# Patient Record
Sex: Male | Born: 1935
Health system: Southern US, Community
[De-identification: ages and names within clinical notes are randomized; demographics above are authoritative.]

## PROBLEM LIST (undated history)

## (undated) DIAGNOSIS — J449 Chronic obstructive pulmonary disease, unspecified: Secondary | ICD-10-CM

## (undated) DIAGNOSIS — E785 Hyperlipidemia, unspecified: Secondary | ICD-10-CM

## (undated) DIAGNOSIS — R42 Dizziness and giddiness: Secondary | ICD-10-CM

## (undated) DIAGNOSIS — I10 Essential (primary) hypertension: Secondary | ICD-10-CM

## (undated) DIAGNOSIS — I2510 Atherosclerotic heart disease of native coronary artery without angina pectoris: Secondary | ICD-10-CM

## (undated) HISTORY — DX: Chronic obstructive pulmonary disease, unspecified: J44.9

## (undated) HISTORY — DX: Atherosclerotic heart disease of native coronary artery without angina pectoris: I25.10

## (undated) HISTORY — DX: Hyperlipidemia, unspecified: E78.5

## (undated) HISTORY — DX: Dizziness and giddiness: R42

## (undated) HISTORY — PX: CARDIAC SURGERY: SHX584

## (undated) HISTORY — DX: Essential (primary) hypertension: I10

## (undated) HISTORY — PX: CORONARY ARTERY BYPASS GRAFT: SHX141

---

## 1999-03-17 ENCOUNTER — Emergency Department (HOSPITAL_COMMUNITY): Admission: EM | Admit: 1999-03-17 | Discharge: 1999-03-17 | Payer: Self-pay | Admitting: *Deleted

## 1999-03-17 ENCOUNTER — Encounter: Payer: Self-pay | Admitting: *Deleted

## 2000-08-13 ENCOUNTER — Encounter: Payer: Self-pay | Admitting: Family Medicine

## 2000-08-13 ENCOUNTER — Encounter: Admission: RE | Admit: 2000-08-13 | Discharge: 2000-08-13 | Payer: Self-pay | Admitting: Family Medicine

## 2003-07-17 ENCOUNTER — Emergency Department (HOSPITAL_COMMUNITY): Admission: EM | Admit: 2003-07-17 | Discharge: 2003-07-17 | Payer: Self-pay | Admitting: Emergency Medicine

## 2004-04-07 ENCOUNTER — Ambulatory Visit (HOSPITAL_COMMUNITY): Admission: RE | Admit: 2004-04-07 | Discharge: 2004-04-07 | Payer: Self-pay | Admitting: Gastroenterology

## 2005-11-21 ENCOUNTER — Emergency Department (HOSPITAL_COMMUNITY): Admission: EM | Admit: 2005-11-21 | Discharge: 2005-11-21 | Payer: Self-pay | Admitting: Emergency Medicine

## 2012-02-12 ENCOUNTER — Other Ambulatory Visit (HOSPITAL_COMMUNITY): Payer: Self-pay | Admitting: Family Medicine

## 2012-02-12 DIAGNOSIS — R222 Localized swelling, mass and lump, trunk: Secondary | ICD-10-CM

## 2012-02-16 ENCOUNTER — Ambulatory Visit (HOSPITAL_COMMUNITY)
Admission: RE | Admit: 2012-02-16 | Discharge: 2012-02-16 | Disposition: A | Discharge status: Home / Self Care | Payer: Medicare Other | Source: Ambulatory Visit | Attending: Family Medicine | Admitting: Family Medicine

## 2012-02-16 DIAGNOSIS — R911 Solitary pulmonary nodule: Secondary | ICD-10-CM | POA: Insufficient documentation

## 2012-02-16 DIAGNOSIS — R222 Localized swelling, mass and lump, trunk: Secondary | ICD-10-CM

## 2012-05-22 ENCOUNTER — Encounter: Payer: Self-pay | Admitting: Family Medicine

## 2012-05-22 ENCOUNTER — Ambulatory Visit (INDEPENDENT_AMBULATORY_CARE_PROVIDER_SITE_OTHER): Payer: Medicare Other | Admitting: Family Medicine

## 2012-05-22 VITALS — BP 126/69 | HR 60 | Temp 97.5°F | Ht 66.0 in | Wt 211.0 lb

## 2012-05-22 DIAGNOSIS — H60399 Other infective otitis externa, unspecified ear: Secondary | ICD-10-CM

## 2012-05-22 DIAGNOSIS — H6120 Impacted cerumen, unspecified ear: Secondary | ICD-10-CM

## 2012-05-22 DIAGNOSIS — H6690 Otitis media, unspecified, unspecified ear: Secondary | ICD-10-CM

## 2012-05-22 DIAGNOSIS — H6121 Impacted cerumen, right ear: Secondary | ICD-10-CM

## 2012-05-22 DIAGNOSIS — H6691 Otitis media, unspecified, right ear: Secondary | ICD-10-CM

## 2012-05-22 DIAGNOSIS — H60391 Other infective otitis externa, right ear: Secondary | ICD-10-CM

## 2012-05-22 DIAGNOSIS — K148 Other diseases of tongue: Secondary | ICD-10-CM

## 2012-05-22 MED ORDER — AMOXICILLIN-POT CLAVULANATE 875-125 MG PO TABS: 1.0000 | ORAL_TABLET | Freq: Two times a day (BID) | ORAL | Status: DC

## 2012-05-22 MED ORDER — CIPROFLOXACIN-DEXAMETHASONE 0.3-0.1 % OT SUSP: 4.0000 [drp] | Freq: Two times a day (BID) | OTIC | Status: DC

## 2012-05-22 NOTE — Progress Notes (Signed)
R ear irrigated until clear.  Pt tolerated well.

## 2012-05-22 NOTE — Progress Notes (Signed)
  Subjective:    Patient ID: Matthew Franklin, male    DOB: [birth year], 78 y.o.   MRN: [id number]  HPI R ear itching x 1-2 weeks  Has had R ear irritation  Wears bilateral hearing aids.  Minimal ear pain.  Tried putting alcohol in ear, caused some bleeding No headache.  Also with tongue lesion over last 3 months.  No bleeding or redness Prior smoker many years ago.  No chewing tobacco.  No alcohol per pt.    Review of Systems  All other systems reviewed and are negative.       Objective:   Physical Exam  Constitutional: He appears well-developed and well-nourished.  HENT:  Head: Normocephalic and atraumatic.  Mouth/Throat:    + R ear canal erythema. Mild tenderness to otoscopic evaluation. + mild TM bulging. Mild R cerumen impaction.   + flat mildly erythematous lesion on central tongue   Eyes: Conjunctivae are normal. Pupils are equal, round, and reactive to light.  Neck: Normal range of motion. Neck supple.  Cardiovascular: Normal rate and regular rhythm.   Pulmonary/Chest: Effort normal.  Abdominal: Soft.  Musculoskeletal: Normal range of motion.  Lymphadenopathy:    He has no cervical adenopathy.  Neurological: He is alert.  Skin: Skin is warm.      Assessment & Plan:  Ear irrigation at bedside.  Will place on ciprodex and augmentin for infectious coverage.  Follow up with ENT for tongue lesion.     The patient and/or caregiver has been counseled thoroughly with regard to treatment plan and/or medications prescribed including dosage, schedule, interactions, rationale for use, and possible side effects and they verbalize understanding. Diagnoses and expected course of recovery discussed and will return if not improved as expected or if the condition worsens. Patient and/or caregiver verbalized understanding.

## 2012-05-22 NOTE — Patient Instructions (Signed)
Cerumen Impaction A cerumen impaction is when the wax in your ear forms a plug. This plug usually causes reduced hearing. Sometimes it also causes an earache or dizziness. Removing a cerumen impaction can be difficult and painful. The wax sticks to the ear canal. The canal is sensitive and bleeds easily. If you try to remove a heavy wax buildup with a cotton tipped swab, you may push it in further. Irrigation with water, suction, and small ear curettes may be used to clear out the wax. If the impaction is fixed to the skin in the ear canal, ear drops may be needed for a few days to loosen the wax. People who build up a lot of wax frequently can use ear wax removal products available in your local drugstore. SEEK MEDICAL CARE IF:  You develop an earache, increased hearing loss, or marked dizziness. Document Released: 01/27/2004 Document Revised: 03/13/2011 Document Reviewed: 03/18/2009 [employer] Patient Information 2014 [employer].  

## 2012-06-18 ENCOUNTER — Other Ambulatory Visit: Payer: Self-pay | Admitting: *Deleted

## 2012-06-19 NOTE — Telephone Encounter (Signed)
Started refill but denied

## 2012-06-20 ENCOUNTER — Other Ambulatory Visit (INDEPENDENT_AMBULATORY_CARE_PROVIDER_SITE_OTHER): Payer: Medicare Other

## 2012-06-20 DIAGNOSIS — I10 Essential (primary) hypertension: Secondary | ICD-10-CM

## 2012-06-20 DIAGNOSIS — E785 Hyperlipidemia, unspecified: Secondary | ICD-10-CM

## 2012-06-20 LAB — COMPLETE METABOLIC PANEL WITH GFR
ALT: 14 U/L (ref 0–53)
AST: 12 U/L (ref 0–37)
Albumin: 4 g/dL (ref 3.5–5.2)
Alkaline Phosphatase: 42 U/L (ref 39–117)
BUN: 16 mg/dL (ref 6–23)
CO2: 28 mEq/L (ref 19–32)
Calcium: 9.3 mg/dL (ref 8.4–10.5)
Chloride: 104 mEq/L (ref 96–112)
Creat: 1.28 mg/dL (ref 0.50–1.35)
GFR, Est African American: 62 mL/min
GFR, Est Non African American: 54 mL/min — ABNORMAL LOW
Glucose, Bld: 114 mg/dL — ABNORMAL HIGH (ref 70–99)
Potassium: 5.3 mEq/L (ref 3.5–5.3)
Sodium: 140 mEq/L (ref 135–145)
Total Bilirubin: 0.6 mg/dL (ref 0.3–1.2)
Total Protein: 6.7 g/dL (ref 6.0–8.3)

## 2012-06-20 NOTE — Progress Notes (Signed)
Patient came in for labs only.

## 2012-06-21 LAB — NMR LIPOPROFILE WITH LIPIDS
Cholesterol, Total: 157 mg/dL (ref <200)
HDL Particle Number: 33.4 umol/L (ref >=30.5)
HDL Size: 8.8 nm — ABNORMAL LOW (ref >=9.2)
HDL-C: 43 mg/dL (ref >=40)
LDL (calc): 91 mg/dL (ref <100)
LDL Particle Number: 1494 nmol/L — ABNORMAL HIGH (ref <1000)
LDL Size: 20 nm — ABNORMAL LOW (ref >20.5)
LP-IR Score: 67 — ABNORMAL HIGH (ref <=45)
Large HDL-P: 4.2 umol/L — ABNORMAL LOW (ref >=4.8)
Large VLDL-P: 4.9 nmol/L — ABNORMAL HIGH (ref <=2.7)
Small LDL Particle Number: 1057 nmol/L — ABNORMAL HIGH (ref <=527)
Triglycerides: 116 mg/dL (ref <150)
VLDL Size: 52.3 nm — ABNORMAL HIGH (ref <=46.6)

## 2012-07-16 ENCOUNTER — Other Ambulatory Visit: Payer: Self-pay | Admitting: Family Medicine

## 2012-07-26 ENCOUNTER — Other Ambulatory Visit: Payer: Self-pay | Admitting: *Deleted

## 2012-07-26 MED ORDER — LOSARTAN POTASSIUM-HCTZ 50-12.5 MG PO TABS: 1.0000 | ORAL_TABLET | Freq: Every day | ORAL | Status: DC

## 2012-07-26 MED ORDER — SIMVASTATIN 20 MG PO TABS: 20.0000 mg | ORAL_TABLET | Freq: Every day | ORAL | Status: DC

## 2012-09-17 ENCOUNTER — Other Ambulatory Visit: Payer: Self-pay

## 2012-09-17 MED ORDER — METOPROLOL TARTRATE 50 MG PO TABS: 50.0000 mg | ORAL_TABLET | Freq: Every day | ORAL | Status: DC

## 2012-10-28 ENCOUNTER — Other Ambulatory Visit: Payer: Self-pay

## 2012-10-28 NOTE — Telephone Encounter (Signed)
Last seen 05/22/12  Dr Alvester Morin   This not on [facility] list

## 2012-11-06 ENCOUNTER — Other Ambulatory Visit: Payer: Self-pay

## 2012-11-06 MED ORDER — OMEPRAZOLE 20 MG PO CPDR: 20.0000 mg | DELAYED_RELEASE_CAPSULE | Freq: Every day | ORAL | Status: DC

## 2012-11-15 ENCOUNTER — Other Ambulatory Visit: Payer: Self-pay | Admitting: *Deleted

## 2012-11-15 MED ORDER — METOPROLOL TARTRATE 50 MG PO TABS: 50.0000 mg | ORAL_TABLET | Freq: Every day | ORAL | Status: DC

## 2012-11-19 ENCOUNTER — Ambulatory Visit (INDEPENDENT_AMBULATORY_CARE_PROVIDER_SITE_OTHER): Payer: Medicare Other | Admitting: Family Medicine

## 2012-11-19 ENCOUNTER — Encounter: Payer: Self-pay | Admitting: Family Medicine

## 2012-11-19 VITALS — BP 138/84 | HR 71 | Temp 97.8°F | Ht 66.0 in | Wt 216.4 lb

## 2012-11-19 DIAGNOSIS — R42 Dizziness and giddiness: Secondary | ICD-10-CM

## 2012-11-19 DIAGNOSIS — J4489 Other specified chronic obstructive pulmonary disease: Secondary | ICD-10-CM

## 2012-11-19 DIAGNOSIS — H60399 Other infective otitis externa, unspecified ear: Secondary | ICD-10-CM

## 2012-11-19 DIAGNOSIS — J309 Allergic rhinitis, unspecified: Secondary | ICD-10-CM

## 2012-11-19 DIAGNOSIS — K219 Gastro-esophageal reflux disease without esophagitis: Secondary | ICD-10-CM

## 2012-11-19 DIAGNOSIS — H60391 Other infective otitis externa, right ear: Secondary | ICD-10-CM

## 2012-11-19 DIAGNOSIS — E785 Hyperlipidemia, unspecified: Secondary | ICD-10-CM | POA: Insufficient documentation

## 2012-11-19 DIAGNOSIS — J449 Chronic obstructive pulmonary disease, unspecified: Secondary | ICD-10-CM

## 2012-11-19 DIAGNOSIS — I10 Essential (primary) hypertension: Secondary | ICD-10-CM

## 2012-11-19 LAB — POCT CBC
Granulocyte percent: 61.9 %G (ref 37–80)
HCT, POC: 46.3 % (ref 43.5–53.7)
Hemoglobin: 15.3 g/dL (ref 14.1–18.1)
Lymph, poc: 2.2 (ref 0.6–3.4)
MCH, POC: 33.3 pg — AB (ref 27–31.2)
MCHC: 32.9 g/dL (ref 31.8–35.4)
MCV: 101.3 fL — AB (ref 80–97)
MPV: 7.2 fL (ref 0–99.8)
POC Granulocyte: 4.1 (ref 2–6.9)
POC LYMPH PERCENT: 32.1 %L (ref 10–50)
Platelet Count, POC: 275 10*3/uL (ref 142–424)
RBC: 4.6 M/uL — AB (ref 4.69–6.13)
RDW, POC: 12.2 %
WBC: 6.7 10*3/uL (ref 4.6–10.2)

## 2012-11-19 MED ORDER — METOPROLOL TARTRATE 50 MG PO TABS: 50.0000 mg | ORAL_TABLET | Freq: Every day | ORAL | Status: DC

## 2012-11-19 MED ORDER — CIPROFLOXACIN-HYDROCORTISONE 0.2-1 % OT SUSP: 3.0000 [drp] | Freq: Two times a day (BID) | OTIC | Status: DC

## 2012-11-19 MED ORDER — SIMVASTATIN 20 MG PO TABS: 20.0000 mg | ORAL_TABLET | Freq: Every day | ORAL | Status: DC

## 2012-11-19 MED ORDER — OMEPRAZOLE 20 MG PO CPDR: 20.0000 mg | DELAYED_RELEASE_CAPSULE | Freq: Every day | ORAL | Status: DC

## 2012-11-19 MED ORDER — LOSARTAN POTASSIUM-HCTZ 50-12.5 MG PO TABS: 1.0000 | ORAL_TABLET | Freq: Every day | ORAL | Status: DC

## 2012-11-19 MED ORDER — FLUTICASONE-SALMETEROL 250-50 MCG/DOSE IN AEPB: 1.0000 | INHALATION_SPRAY | Freq: Two times a day (BID) | RESPIRATORY_TRACT | Status: DC

## 2012-11-19 MED ORDER — MECLIZINE HCL 25 MG PO TABS: 25.0000 mg | ORAL_TABLET | Freq: Three times a day (TID) | ORAL | Status: DC | PRN

## 2012-11-19 NOTE — Patient Instructions (Signed)
Health Maintenance, Males A healthy lifestyle and preventative care can promote health and wellness.  Maintain regular health, dental, and eye exams.  Eat a healthy diet. Foods like vegetables, fruits, whole grains, low-fat dairy products, and lean protein foods contain the nutrients you need without too many calories. Decrease your intake of foods high in solid fats, added sugars, and salt. Get information about a proper diet from your caregiver, if necessary.  Regular physical exercise is one of the most important things you can do for your health. Most adults should get at least 150 minutes of moderate-intensity exercise (any activity that increases your heart rate and causes you to sweat) each week. In addition, most adults need muscle-strengthening exercises on 2 or more days a week.   Maintain a healthy weight. The body mass index (BMI) is a screening tool to identify possible weight problems. It provides an estimate of body fat based on height and weight. Your caregiver can help determine your BMI, and can help you achieve or maintain a healthy weight. For adults 20 years and older:  A BMI below 18.5 is considered underweight.  A BMI of 18.5 to 24.9 is normal.  A BMI of 25 to 29.9 is considered overweight.  A BMI of 30 and above is considered obese.  Maintain normal blood lipids and cholesterol by exercising and minimizing your intake of saturated fat. Eat a balanced diet with plenty of fruits and vegetables. Blood tests for lipids and cholesterol should begin at age 20 and be repeated every 5 years. If your lipid or cholesterol levels are high, you are over 50, or you are a high risk for heart disease, you may need your cholesterol levels checked more frequently.Ongoing high lipid and cholesterol levels should be treated with medicines, if diet and exercise are not effective.  If you smoke, find out from your caregiver how to quit. If you do not use tobacco, do not start.  Lung  cancer screening is recommended for adults aged 55 80 years who are at high risk for developing lung cancer because of a history of smoking. Yearly low-dose computed tomography (CT) is recommended for people who have at least a 30-pack-year history of smoking and are a current smoker or have quit within the past 15 years. A pack year of smoking is smoking an average of 1 pack of cigarettes a day for 1 year (for example: 1 pack a day for 30 years or 2 packs a day for 15 years). Yearly screening should continue until the smoker has stopped smoking for at least 15 years. Yearly screening should also be stopped for people who develop a health problem that would prevent them from having lung cancer treatment.  If you choose to drink alcohol, do not exceed 2 drinks per day. One drink is considered to be 12 ounces (355 mL) of beer, 5 ounces (148 mL) of wine, or 1.5 ounces (44 mL) of liquor.  Avoid use of street drugs. Do not share needles with anyone. Ask for help if you need support or instructions about stopping the use of drugs.  High blood pressure causes heart disease and increases the risk of stroke. Blood pressure should be checked at least every 1 to 2 years. Ongoing high blood pressure should be treated with medicines if weight loss and exercise are not effective.  If you are 45 to 76 years old, ask your caregiver if you should take aspirin to prevent heart disease.  Diabetes screening involves taking a blood   sample to check your fasting blood sugar level. This should be done once every 3 years, after age 45, if you are within normal weight and without risk factors for diabetes. Testing should be considered at a younger age or be carried out more frequently if you are overweight and have at least 1 risk factor for diabetes.  Colorectal cancer can be detected and often prevented. Most routine colorectal cancer screening begins at the age of 50 and continues through age 75. However, your caregiver may  recommend screening at an earlier age if you have risk factors for colon cancer. On a yearly basis, your caregiver may provide home test kits to check for hidden blood in the stool. Use of a small camera at the end of a tube, to directly examine the colon (sigmoidoscopy or colonoscopy), can detect the earliest forms of colorectal cancer. Talk to your caregiver about this at age 50, when routine screening begins. Direct examination of the colon should be repeated every 5 to 10 years through age 75, unless early forms of pre-cancerous polyps or small growths are found.  Hepatitis C blood testing is recommended for all people born from 1945 through 1965 and any individual with known risks for hepatitis C.  Healthy men should no longer receive prostate-specific antigen (PSA) blood tests as part of routine cancer screening. Consult with your caregiver about prostate cancer screening.  Testicular cancer screening is not recommended for adolescents or adult males who have no symptoms. Screening includes self-exam, caregiver exam, and other screening tests. Consult with your caregiver about any symptoms you have or any concerns you have about testicular cancer.  Practice safe sex. Use condoms and avoid high-risk sexual practices to reduce the spread of sexually transmitted infections (STIs).  Use sunscreen. Apply sunscreen liberally and repeatedly throughout the day. You should seek shade when your shadow is shorter than you. Protect yourself by wearing long sleeves, pants, a wide-brimmed hat, and sunglasses year round, whenever you are outdoors.  Notify your caregiver of new moles or changes in moles, especially if there is a change in shape or color. Also notify your caregiver if a mole is larger than the size of a pencil eraser.  A one-time screening for abdominal aortic aneurysm (AAA) and surgical repair of large AAAs by sound wave imaging (ultrasonography) is recommended for ages 65 to 75 years who are  current or former smokers.  Stay current with your immunizations. Document Released: 06/17/2007 Document Revised: 04/15/2012 Document Reviewed: 05/16/2010 Exit[employer] Patient Information 2014 [employer].  

## 2012-11-19 NOTE — Progress Notes (Signed)
Subjective:    Patient ID: Matthew Franklin, male    DOB: [birth year], 78 y.o.   MRN: [id number]  Hypertension This is a chronic problem. The current episode started more than 1 year ago. The problem has been resolved since onset. The problem is controlled. Pertinent negatives include no anxiety, blurred vision, chest pain, palpitations, peripheral edema or shortness of breath. Risk factors for coronary artery disease include dyslipidemia, family history and male gender. Past treatments include beta blockers, diuretics and angiotensin blockers. The current treatment provides moderate improvement. Hypertensive end-organ damage includes CAD/MI. There is no history of a thyroid problem. There is no history of sleep apnea.  Gastrophageal Reflux He reports no abdominal pain, no chest pain, no coughing, no dysphagia, no heartburn, no nausea or no sore throat. This is a chronic problem. The current episode started more than 1 year ago. The problem occurs rarely. The problem has been resolved. He has tried a PPI for the symptoms. The treatment provided moderate relief.  Hyperlipidemia This is a chronic problem. The current episode started more than 1 year ago. The problem is uncontrolled. Recent lipid tests were reviewed and are high. Exacerbating diseases include obesity. He has no history of liver disease. Pertinent negatives include no chest pain or shortness of breath. Current antihyperlipidemic treatment includes statins. The current treatment provides moderate improvement of lipids. Risk factors for coronary artery disease include dyslipidemia, family history, hypertension, male sex and obesity.  COPD Pt currently taking advair daily- Pt states it controlling his breathing well  *Pt with c/o of ears "itching". Pt denies any pain or pressure. Pt states the "itching" started about 6 months and can not wear his hearing aids. Pt states that when he goes days without wearing his hearing aids, the itching goes  away. Pt states he was seen in office, for this same problem, and was prescribed an antibiotic which helped about 6 months ago.     Review of Systems  HENT: Positive for ear pain. Negative for sore throat.   Eyes: Negative for blurred vision.  Respiratory: Negative for cough and shortness of breath.   Cardiovascular: Negative for chest pain and palpitations.  Gastrointestinal: Negative for heartburn, dysphagia, nausea and abdominal pain.  All other systems reviewed and are negative.       Objective:   Physical Exam  Vitals reviewed. Constitutional: He is oriented to person, place, and time. He appears well-developed and well-nourished.  HENT:  Head: Normocephalic.  Right Ear: Decreased hearing is noted.  Left Ear: Decreased hearing is noted.  Erythema in bilateral canal   Eyes: Pupils are equal, round, and reactive to light.  Neck: Normal range of motion. Neck supple. No thyromegaly present.  Cardiovascular: Normal rate, regular rhythm and intact distal pulses.   No murmur heard. Pulmonary/Chest: Effort normal and breath sounds normal. No respiratory distress. He has no wheezes.  Abdominal: Soft. Bowel sounds are normal. There is no tenderness. There is no rebound.  Musculoskeletal: Normal range of motion. He exhibits no edema and no tenderness.  Neurological: He is alert and oriented to person, place, and time.  Skin: Skin is warm and dry.  Psychiatric: He has a normal mood and affect. His behavior is normal. Judgment and thought content normal.    BP 138/84  Pulse 71  Temp(Src) 97.8 F (36.6 C) (Oral)  Ht 5\' 6"  (1.676 m)  Wt 216 lb 6.4 oz (98.158 kg)  BMI 34.94 kg/m2       Assessment & Plan:  GERD (gastroesophageal reflux disease)  Hypertension - Plan: losartan-hydrochlorothiazide (HYZAAR) 50-12.5 MG per tablet, metoprolol (LOPRESSOR) 50 MG tablet, omeprazole (PRILOSEC) 20 MG capsule  Hyperlipidemia LDL goal < 100 - Plan: simvastatin (ZOCOR) 20 MG tablet  COPD  (chronic obstructive pulmonary disease) - Plan: POCT CBC, CMP14+EGFR, Lipid panel, Thyroid Panel With TSH  Vertigo - Plan: meclizine (ANTIVERT) 25 MG tablet  Allergic rhinitis - Plan: Fluticasone-Salmeterol (ADVAIR DISKUS) 250-50 MCG/DOSE AEPB  Otitis, externa, infective, right - Plan: ciprofloxacin-hydrocortisone (CIPRO HC) otic suspension  Deatra Canter FNP

## 2012-11-20 LAB — THYROID PANEL WITH TSH
Free Thyroxine Index: 2 (ref 1.2–4.9)
T3 Uptake Ratio: 28 % (ref 24–39)
T4, Total: 7.1 ug/dL (ref 4.5–12.0)
TSH: 1.87 u[IU]/mL (ref 0.450–4.500)

## 2012-11-20 LAB — CMP14+EGFR
ALT: 17 IU/L (ref 0–44)
AST: 14 IU/L (ref 0–40)
Albumin/Globulin Ratio: 1.9 (ref 1.1–2.5)
Albumin: 4.4 g/dL (ref 3.5–4.8)
Alkaline Phosphatase: 58 IU/L (ref 39–117)
BUN/Creatinine Ratio: 18 (ref 10–22)
BUN: 16 mg/dL (ref 8–27)
CO2: 27 mmol/L (ref 18–29)
Calcium: 9.8 mg/dL (ref 8.6–10.2)
Chloride: 97 mmol/L (ref 97–108)
Creatinine, Ser: 0.87 mg/dL (ref 0.76–1.27)
GFR calc Af Amer: 97 mL/min/{1.73_m2} (ref >59)
GFR calc non Af Amer: 84 mL/min/{1.73_m2} (ref >59)
Globulin, Total: 2.3 g/dL (ref 1.5–4.5)
Glucose: 113 mg/dL — ABNORMAL HIGH (ref 65–99)
Potassium: 4.8 mmol/L (ref 3.5–5.2)
Sodium: 138 mmol/L (ref 134–144)
Total Bilirubin: 0.3 mg/dL (ref 0.0–1.2)
Total Protein: 6.7 g/dL (ref 6.0–8.5)

## 2012-11-20 LAB — LIPID PANEL
Chol/HDL Ratio: 3.9 ratio units (ref 0.0–5.0)
Cholesterol, Total: 204 mg/dL — ABNORMAL HIGH (ref 100–199)
HDL: 52 mg/dL (ref >39)
LDL Calculated: 118 mg/dL — ABNORMAL HIGH (ref 0–99)
Triglycerides: 169 mg/dL — ABNORMAL HIGH (ref 0–149)
VLDL Cholesterol Cal: 34 mg/dL (ref 5–40)

## 2012-12-03 ENCOUNTER — Telehealth: Payer: Self-pay | Admitting: Family Medicine

## 2013-01-13 ENCOUNTER — Telehealth: Payer: Self-pay | Admitting: Family Medicine

## 2013-01-13 NOTE — Telephone Encounter (Signed)
appt made

## 2013-01-16 ENCOUNTER — Ambulatory Visit (INDEPENDENT_AMBULATORY_CARE_PROVIDER_SITE_OTHER): Payer: Medicare HMO | Admitting: Family Medicine

## 2013-01-16 ENCOUNTER — Encounter: Payer: Self-pay | Admitting: Family Medicine

## 2013-01-16 VITALS — BP 96/56 | HR 72 | Temp 98.7°F | Ht 66.0 in | Wt 213.0 lb

## 2013-01-16 DIAGNOSIS — F411 Generalized anxiety disorder: Secondary | ICD-10-CM

## 2013-01-16 DIAGNOSIS — J441 Chronic obstructive pulmonary disease with (acute) exacerbation: Secondary | ICD-10-CM

## 2013-01-16 DIAGNOSIS — G4733 Obstructive sleep apnea (adult) (pediatric): Secondary | ICD-10-CM

## 2013-01-16 DIAGNOSIS — J449 Chronic obstructive pulmonary disease, unspecified: Secondary | ICD-10-CM

## 2013-01-16 DIAGNOSIS — H6120 Impacted cerumen, unspecified ear: Secondary | ICD-10-CM

## 2013-01-16 DIAGNOSIS — R0902 Hypoxemia: Secondary | ICD-10-CM

## 2013-01-16 DIAGNOSIS — Z23 Encounter for immunization: Secondary | ICD-10-CM

## 2013-01-16 MED ORDER — ACLIDINIUM BROMIDE 400 MCG/ACT IN AEPB: 400.0000 ug | INHALATION_SPRAY | Freq: Two times a day (BID) | RESPIRATORY_TRACT | Status: DC

## 2013-01-16 MED ORDER — ALBUTEROL SULFATE (2.5 MG/3ML) 0.083% IN NEBU: 2.5000 mg | INHALATION_SOLUTION | Freq: Four times a day (QID) | RESPIRATORY_TRACT | Status: DC | PRN

## 2013-01-16 MED ORDER — ALPRAZOLAM 0.5 MG PO TABS: 0.5000 mg | ORAL_TABLET | Freq: Two times a day (BID) | ORAL | Status: DC | PRN

## 2013-01-16 MED ORDER — SIMVASTATIN 40 MG PO TABS: 40.0000 mg | ORAL_TABLET | Freq: Every day | ORAL | Status: DC

## 2013-01-16 NOTE — Patient Instructions (Addendum)
Pneumococcal Vaccine, Polyvalent suspension for injection What is this medicine? PNEUMOCOCCAL VACCINE, POLYVALENT (NEU mo KOK al vak SEEN, pol ee VEY luhnt) is a vaccine to prevent pneumococcus bacteria infection. These bacteria are a major cause of ear infections, 'Strep throat' infections, and serious pneumonia, meningitis, or blood infections worldwide. These vaccines help the body to produce antibodies (protective substances) that help your body defend against these bacteria. This vaccine is recommended for infants and young children. This vaccine will not treat an infection. This medicine may be used for other purposes; ask your health care provider or pharmacist if you have questions. COMMON BRAND NAME(S): Prevnar 13 , Prevnar What should I tell my health care provider before I take this medicine? They need to know if you have any of these conditions: -bleeding problems -fever -immune system problems -low platelet count in the blood -seizures -an unusual or allergic reaction to pneumococcal vaccine, diphtheria toxoid, other vaccines, latex, other medicines, foods, dyes, or preservatives -pregnant or trying to get pregnant -breast-feeding How should I use this medicine? This vaccine is for injection into a muscle. It is given by a health care professional. A copy of Vaccine Information Statements will be given before each vaccination. Read this sheet carefully each time. The sheet may change frequently. Talk to your pediatrician regarding the use of this medicine in children. While this drug may be prescribed for children as young as 23 weeks old for selected conditions, precautions do apply. Overdosage: If you think you have taken too much of this medicine contact a poison control center or emergency room at once. NOTE: This medicine is only for you. Do not share this medicine with others. What if I miss a dose? It is important not to miss your dose. Call your doctor or health care  professional if you are unable to keep an appointment. What may interact with this medicine? -medicines for cancer chemotherapy -medicines that suppress your immune function -medicines that treat or prevent blood clots like warfarin, enoxaparin, and dalteparin -steroid medicines like prednisone or cortisone This list may not describe all possible interactions. Give your health care provider a list of all the medicines, herbs, non-prescription drugs, or dietary supplements you use. Also tell them if you smoke, drink alcohol, or use illegal drugs. Some items may interact with your medicine. What should I watch for while using this medicine? Mild fever and pain should go away in 3 days or less. Report any unusual symptoms to your doctor or health care professional. What side effects may I notice from receiving this medicine? Side effects that you should report to your doctor or health care professional as soon as possible: -allergic reactions like skin rash, itching or hives, swelling of the face, lips, or tongue -breathing problems -confused -fever over 102 degrees F -pain, tingling, numbness in the hands or feet -seizures -unusual bleeding or bruising -unusual muscle weakness Side effects that usually do not require medical attention (report to your doctor or health care professional if they continue or are bothersome): -aches and pains -diarrhea -fever of 102 degrees F or less -headache -irritable -loss of appetite -pain, tender at site where injected -trouble sleeping This list may not describe all possible side effects. Call your doctor for medical advice about side effects. You may report side effects to FDA at 1-800-FDA-1088. Where should I keep my medicine? This does not apply. This vaccine is given in a clinic, pharmacy, doctor's office, or other health care setting and will not be stored at  home. NOTE: This sheet is a summary. It may not cover all possible information. If you  have questions about this medicine, talk to your doctor, pharmacist, or health care provider.  2014, [employer]vier/Gold Standard. (2008-03-03 10:17:22) Chronic Obstructive Pulmonary Disease Chronic obstructive pulmonary disease (COPD) is a common lung condition in which airflow from the lungs is limited. COPD is a general term that can be used to describe many different lung problems that limit airflow, including both chronic bronchitis and emphysema. If you have COPD, your lung function will probably never return to normal, but there are measures you can take to improve lung function and make yourself feel better.  CAUSES   Smoking (common).   Exposure to secondhand smoke.   Genetic problems.  Chronic inflammatory lung diseases or recurrent infections. SYMPTOMS   Shortness of breath, especially with physical activity.   Deep, persistent (chronic) cough with a large amount of thick mucus.   Wheezing.   Rapid breaths (tachypnea).   Gray or bluish discoloration (cyanosis) of the skin, especially in fingers, toes, or lips.   Fatigue.   Weight loss.   Frequent infections or episodes when breathing symptoms become much worse (exacerbations).   Chest tightness. DIAGNOSIS  Your healthcare provider will take a medical history and perform a physical examination to make the initial diagnosis. Additional tests for COPD may include:   Lung (pulmonary) function tests.  Chest X-ray.  CT scan.  Blood tests. TREATMENT  Treatment available to help you feel better when you have COPD include:   Inhaler and nebulizer medicines. These help manage the symptoms of COPD and make your breathing more comfortable  Supplemental oxygen. Supplemental oxygen is only helpful if you have a low oxygen level in your blood.   Exercise and physical activity. These are beneficial for nearly all people with COPD. Some people may also benefit from a pulmonary rehabilitation program. HOME CARE  INSTRUCTIONS   Take all medicines (inhaled or pills) as directed by your health care provider.  Only take over-the-counter or prescription medicines for pain, fever, or discomfort as directed by your health care provider.   Avoid over-the-counter medicines or cough syrups that dry up your airway (such as antihistamines) and slow down the elimination of secretions unless instructed otherwise by your healthcare provider.   If you are a smoker, the most important thing that you can do is stop smoking. Continuing to smoke will cause further lung damage and breathing trouble. Ask your health care provider for help with quitting smoking. He or she can direct you to community resources or hospitals that provide support.  Avoid exposure to irritants such as smoke, chemicals, and fumes that aggravate your breathing.  Use oxygen therapy and pulmonary rehabilitation if directed by your health care provider. If you require home oxygen therapy, ask your healthcare provider whether you should purchase a pulse oximeter to measure your oxygen level at home.   Avoid contact with individuals who have a contagious illness.  Avoid extreme temperature and humidity changes.  Eat healthy foods. Eating smaller, more frequent meals and resting before meals may help you maintain your strength.  Stay active, but balance activity with periods of rest. Exercise and physical activity will help you maintain your ability to do things you want to do.  Preventing infection and hospitalization is very important when you have COPD. Make sure to receive all the vaccines your health care provider recommends, especially the pneumococcal and influenza vaccines. Ask your healthcare provider whether you need a  pneumonia vaccine.  Learn and use relaxation techniques to manage stress.  Learn and use controlled breathing techniques as directed by your health care provider. Controlled breathing techniques include:   Pursed lip  breathing. Start by breathing in (inhaling) through your nose for 1 second. Then, purse your lips as if you were going to whistle and breathe out (exhale) through the pursed lips for 2 seconds.   Diaphragmatic breathing. Start by putting one hand on your abdomen just above your waist. Inhale slowly through your nose. The hand on your abdomen should move out. Then purse your lips and exhale slowly. You should be able to feel the hand on your abdomen moving in as you exhale.   Learn and use controlled coughing to clear mucus from your lungs. Controlled coughing is a series of short, progressive coughs. The steps of controlled coughing are:  1. Lean your head slightly forward.  2. Breathe in deeply using diaphragmatic breathing.  3. Try to hold your breath for 3 seconds.  4. Keep your mouth slightly open while coughing twice.  5. Spit any mucus out into a tissue.  6. Rest and repeat the steps once or twice as needed. SEEK MEDICAL CARE IF:   You are coughing up more mucus than usual.   There is a change in the color or thickness of your mucus.   Your breathing is more labored than usual.   Your breathing is faster than usual.  SEEK IMMEDIATE MEDICAL CARE IF:   You have shortness of breath while you are resting.   You have shortness of breath that prevents you from:  Being able to talk.   Performing your usual physical activities.   You have chest pain lasting longer than 5 minutes.   Your skin color is more cyanotic than usual.  You measure low oxygen saturations for longer than 5 minutes with a pulse oximeter. MAKE SURE YOU:   Understand these instructions.  Will watch your condition.  Will get help right away if you are not doing well or get worse. Document Released: 09/28/2004 Document Revised: 10/09/2012 Document Reviewed: 08/15/2012 [facility] Patient Information 2014 [employer], [place].

## 2013-01-16 NOTE — Progress Notes (Signed)
   Subjective:    Patient ID: Matthew Franklin, male    DOB: 06-05-1934, 79 y.o.   MRN: [id number]  HPI  This 79 y.o. male presents for evaluation of hospital follow up.  He has had copd exacerbation and was told he needs a sleep study ordered.  He was seen at the ED at [facility]. He was told his oxygen saturation was 70%.  He was given IV steroids, IV abx's, and neb tx's and had 4 day hospital stay last week.  He was told to get sleep study by the hospitalist for OSA.  He has just finished An over night oximetry study was done through [place] last night. He states his insurance company Told him to get this done to get oxygen at night.  He is not been seen by Pulmonary.  He has been  Having problems with his ears and has roaring in his ears.  He has problems with cerumen impaction And he has been having reduced hearing. He has been having anxiety.  He is going through IRS audit.   Review of Systems C/o cerumen impaction and SOB.   No chest pain, SOB, HA, dizziness, vision change, N/V, diarrhea, constipation, dysuria, urinary urgency or frequency, myalgias, arthralgias or rash.  Objective:   Physical Exam  Vital signs noted  Well developed well nourished male.  HEENT - Head atraumatic Normocephalic                Eyes - PERRLA, Conjuctiva - clear Sclera- Clear EOMI                Ears - EAC's cerumen impaction.  After Irrigation EAC's clear and  TM's Wnl Gross Hearing WNL                Throat - oropharanx wnl Respiratory - Lungs CTA bilateral Cardiac - RRR S1 and S2 without murmur GI - Abdomen soft Nontender and bowel sounds active x 4 Extremities - No edema. Neuro - Grossly intact.      Assessment & Plan:  COPD exacerbation - Plan: Ambulatory referral to Pulmonology, albuterol (PROVENTIL) (2.5 MG/3ML) 0.083% nebulizer solution, DME Nebulizer machine, Aclidinium Bromide (TUDORZA PRESSAIR) 400 MCG/ACT AEPB  COPD (chronic obstructive pulmonary disease) - Plan: Ambulatory referral to  Pulmonology, albuterol (PROVENTIL) (2.5 MG/3ML) 0.083% nebulizer solution, DME Nebulizer machine, Aclidinium Bromide (TUDORZA PRESSAIR) 400 MCG/ACT AEPB  OSA (obstructive sleep apnea) - Plan: Ambulatory referral to Pulmonology  Hypoxia - Plan: Ambulatory referral to Pulmonology.  Cerumen impaction - Ear irrigation  Anxiety state, unspecified - Plan: ALPRAZolam (XANAX) 0.5 MG tablet  Need for prophylactic vaccination against Streptococcus pneumoniae (pneumococcus) - Plan: Pneumococcal conjugate vaccine 13-valent

## 2013-01-28 ENCOUNTER — Ambulatory Visit (INDEPENDENT_AMBULATORY_CARE_PROVIDER_SITE_OTHER): Payer: Medicare HMO | Admitting: Emergency Medicine

## 2013-01-28 ENCOUNTER — Encounter: Payer: Self-pay | Admitting: Emergency Medicine

## 2013-01-28 VITALS — BP 128/78 | HR 68 | Ht 67.0 in | Wt 214.6 lb

## 2013-01-28 DIAGNOSIS — G4734 Idiopathic sleep related nonobstructive alveolar hypoventilation: Secondary | ICD-10-CM | POA: Insufficient documentation

## 2013-01-28 DIAGNOSIS — J4489 Other specified chronic obstructive pulmonary disease: Secondary | ICD-10-CM

## 2013-01-28 DIAGNOSIS — R0902 Hypoxemia: Secondary | ICD-10-CM

## 2013-01-28 DIAGNOSIS — J449 Chronic obstructive pulmonary disease, unspecified: Secondary | ICD-10-CM

## 2013-01-28 MED ORDER — ALBUTEROL SULFATE HFA 108 (90 BASE) MCG/ACT IN AERS: 2.0000 | INHALATION_SPRAY | RESPIRATORY_TRACT | Status: AC | PRN

## 2013-01-28 NOTE — Progress Notes (Signed)
Subjective:    Patient ID: Matthew Franklin, male    DOB: May 22, 1934, 79 y.o.   MRN: [id number]  HPI 79 yo former smoker, hx HTN, hyperlipidemia. Dx COPD in 2012 when he had an AE-COPD. He was hospitalized again early 1/'15 for apparent AE-COPD, was noted to have nocturnal hypoxemia. He is currently on Advair bid and Tudorza bid + albuterol nebs > uses it about 2x a day. He received prevnar 13 at Dr Gaston Islam office 01/16/13. His wife understands the meds better than he does.   At baseline his breathing is good. Exertional tolerance > able to walk a mile with his wife. He hears wheeze espec at night. Some cough but not significant. He has consistent PND, also has GERD. He is on loratadine and omeprazole.    Review of Systems  Constitutional: Negative for fever and unexpected weight change.  HENT: Positive for congestion, postnasal drip, sinus pressure and sore throat. Negative for dental problem, ear pain, nosebleeds, rhinorrhea, sneezing and trouble swallowing.   Eyes: Negative for redness and itching.  Respiratory: Positive for cough, shortness of breath and wheezing. Negative for chest tightness.   Cardiovascular: Negative for palpitations and leg swelling.  Gastrointestinal: Negative for nausea and vomiting.  Genitourinary: Negative for dysuria.  Musculoskeletal: Negative for joint swelling.  Skin: Negative for rash.  Neurological: Negative for headaches.  Hematological: Does not bruise/bleed easily.  Psychiatric/Behavioral: Negative for dysphoric mood. The patient is nervous/anxious.    Past Medical History  Diagnosis Date  . COPD (chronic obstructive pulmonary disease)   . Hypertension   . Hyperlipidemia   . Vertigo      Family History  Problem Relation Age of Onset  . Healthy Mother   . Cancer Father   . Healthy Sister   . Cancer Brother   . Healthy Sister   . Cancer Brother      History   Social History  . Marital Status: Divorced    Spouse Name: N/A    Number of  Children: N/A  . Years of Education: N/A   Occupational History  . Not on file.   Social History Main Topics  . Smoking status: Former Smoker -- 1.00 packs/day for 20 years    Types: Cigarettes    Quit date: 11/19/1997  . Smokeless tobacco: Not on file  . Alcohol Use: Yes  . Drug Use: No  . Sexual Activity: Not on file   Other Topics Concern  . Not on file   Social History Narrative  . 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 Lipitor [Atorvastatin]      Outpatient Prescriptions Prior to Visit  Medication Sig Dispense Refill  . Aclidinium Bromide (TUDORZA PRESSAIR) 400 MCG/ACT AEPB Inhale 400 mcg into the lungs 2 (two) times daily.  1 each  11  . albuterol (PROVENTIL) (2.5 MG/3ML) 0.083% nebulizer solution Take 3 mLs (2.5 mg total) by nebulization every 6 (six) hours as needed for wheezing or shortness of breath.  150 mL  1  . ALPRAZolam (XANAX) 0.5 MG tablet Take 1 tablet (0.5 mg total) by mouth 2 (two) times daily as needed for anxiety.  30 tablet  3  . aspirin 81 MG tablet Take 81 mg by mouth daily.      . Fluticasone-Salmeterol (ADVAIR DISKUS) 250-50 MCG/DOSE AEPB Inhale 1 puff into the lungs 2 (two) times daily.  60 each  11  . folic acid (FOLVITE) 161 MCG tablet Take 400 mcg by mouth daily.      Marland Kitchen ipratropium (ATROVENT HFA) 17 MCG/ACT inhaler Inhale 2 puffs into the lungs every 6 (six) hours.      Marland Kitchen loratadine (CLARITIN) 10 MG tablet Take 10 mg by mouth daily.      Marland Kitchen losartan-hydrochlorothiazide (HYZAAR) 50-12.5 MG per tablet Take 1 tablet by mouth daily.  30 tablet  11  . meclizine (ANTIVERT) 25 MG tablet Take 1 tablet (25 mg total) by mouth 3 (three) times daily as needed for dizziness.  30 tablet  3  . metoprolol (LOPRESSOR) 50 MG tablet Take 1 tablet (50 mg total) by mouth daily.  30 tablet  11  . omeprazole (PRILOSEC) 20 MG capsule Take 1 capsule (20 mg total) by  mouth daily.  30 capsule  11  . simvastatin (ZOCOR) 40 MG tablet Take 1 tablet (40 mg total) by mouth at bedtime.  90 tablet  3   No facility-administered medications prior to visit.         Objective:   Physical Exam Filed Vitals:   01/28/13 0921  BP: 128/78  Pulse: 68  Height: 5\' 7"  (1.702 m)  Weight: 214 lb 9.6 oz (97.342 kg)  SpO2: 95%   Gen: Pleasant, obese, in no distress,  normal affect  ENT: No lesions,  mouth clear,  oropharynx clear, no postnasal drip, significantly decreased hearing  Neck: No JVD, no TMG, no carotid bruits  Lungs: No use of accessory muscles, distant, clear without rales or rhonchi  Cardiovascular: RRR, heart sounds normal, no murmur or gallops, no peripheral edema  Musculoskeletal: No deformities, no cyanosis or clubbing  Neuro: alert, non focal  Skin: Warm, no lesions or rashes       Assessment & Plan:  COPD (chronic obstructive pulmonary disease) - continue Tudorza + Advair - albuterol prn - walking oximetry - full PFT - prevnar done  Nocturnal hypoxemia - walking oximetry today  - will order PSG in [place] - follow after to discuss possible CPAP

## 2013-01-28 NOTE — Assessment & Plan Note (Signed)
-   continue Tudorza + Advair - albuterol prn - walking oximetry - full PFT - prevnar done

## 2013-01-28 NOTE — Patient Instructions (Signed)
Your oxygen level stays normal with walking Please continue your Advair twice a day every day. Rinse out your mouth after using this medication Continue Caprice Renshaw twice a day every day Do not take atrovent Use albuterol, either by nebulizer or 2 puffs from inhaler, up to every 4 hours if needed for shortness of breath.  We will perform full pulmonary function testing at your next office visit We will schedule a sleep study Follow with Dr Lamonte Sakai after sleep study for full PFT

## 2013-01-28 NOTE — Assessment & Plan Note (Signed)
-   walking oximetry today  - will order PSG in [place] - follow after to discuss possible CPAP

## 2013-02-05 ENCOUNTER — Other Ambulatory Visit: Payer: Self-pay | Admitting: Nurse Practitioner

## 2013-02-17 ENCOUNTER — Encounter: Payer: Self-pay | Admitting: Family Medicine

## 2013-02-17 ENCOUNTER — Ambulatory Visit (INDEPENDENT_AMBULATORY_CARE_PROVIDER_SITE_OTHER): Payer: Medicare HMO | Admitting: Family Medicine

## 2013-02-17 VITALS — BP 106/70 | HR 90 | Temp 98.3°F | Ht 66.0 in | Wt 214.8 lb

## 2013-02-17 DIAGNOSIS — J029 Acute pharyngitis, unspecified: Secondary | ICD-10-CM

## 2013-02-17 DIAGNOSIS — J441 Chronic obstructive pulmonary disease with (acute) exacerbation: Secondary | ICD-10-CM

## 2013-02-17 DIAGNOSIS — J449 Chronic obstructive pulmonary disease, unspecified: Secondary | ICD-10-CM

## 2013-02-17 DIAGNOSIS — R05 Cough: Secondary | ICD-10-CM

## 2013-02-17 DIAGNOSIS — R509 Fever, unspecified: Secondary | ICD-10-CM

## 2013-02-17 DIAGNOSIS — R059 Cough, unspecified: Secondary | ICD-10-CM

## 2013-02-17 LAB — POCT INFLUENZA A/B
Influenza A, POC: NEGATIVE
Influenza B, POC: NEGATIVE

## 2013-02-17 LAB — POCT RAPID STREP A (OFFICE): Rapid Strep A Screen: NEGATIVE

## 2013-02-17 MED ORDER — PREDNISONE 10 MG PO TABS: ORAL_TABLET | ORAL | Status: DC

## 2013-02-17 MED ORDER — LEVOFLOXACIN 500 MG PO TABS: 500.0000 mg | ORAL_TABLET | Freq: Every day | ORAL | Status: DC

## 2013-02-17 MED ORDER — HYDROCODONE-HOMATROPINE 5-1.5 MG/5ML PO SYRP: 5.0000 mL | ORAL_SOLUTION | Freq: Four times a day (QID) | ORAL | Status: DC | PRN

## 2013-02-17 MED ORDER — ALBUTEROL SULFATE (2.5 MG/3ML) 0.083% IN NEBU: 2.5000 mg | INHALATION_SOLUTION | Freq: Four times a day (QID) | RESPIRATORY_TRACT | Status: AC | PRN

## 2013-02-17 NOTE — Progress Notes (Signed)
   Subjective:    Patient ID: Matthew Franklin, male    DOB: [birth year], 79 y.o.   MRN: [id number]  HPI  This 79 y.o. male presents for evaluation of SOB cough and wheezing.  He has been Having a mucopurulent productive cough..  Review of Systems    No chest pain, SOB, HA, dizziness, vision change, N/V, diarrhea, constipation, dysuria, urinary urgency or frequency, myalgias, arthralgias or rash.  Objective:   Physical Exam  Vital signs noted  Well developed well nourished male.  HEENT - Head atraumatic Normocephalic                Eyes - PERRLA, Conjuctiva - clear Sclera- Clear EOMI                Ears - EAC's Wnl TM's Wnl Gross Hearing WNL                Nose - Nares patent                 Throat - oropharanx wnl Respiratory - Lungs CTA bilateral Cardiac - RRR S1 and S2 without murmur GI - Abdomen soft Nontender and bowel sounds active x 4 Extremities - No edema. Neuro - Grossly intact.  Results for orders placed in visit on 02/17/13  POCT INFLUENZA A/B      Result Value Ref Range   Influenza A, POC Negative     Influenza B, POC Negative    POCT RAPID STREP A (OFFICE)      Result Value Ref Range   Rapid Strep A Screen Negative  Negative      Assessment & Plan:  Sore throat - Plan: POCT Influenza A/B, POCT rapid strep A  Cough - Plan: POCT Influenza A/B, POCT rapid strep A  Fever - Plan: POCT Influenza A/B, POCT rapid strep A  COPD exacerbation - Plan: levofloxacin (LEVAQUIN) 500 MG tablet, predniSONE (DELTASONE) 10 MG tablet, HYDROcodone-homatropine (HYCODAN) 5-1.5 MG/5ML syrup, albuterol (PROVENTIL) (2.5 MG/3ML) 0.083% nebulizer solution  COPD (chronic obstructive pulmonary disease) - Plan: albuterol (PROVENTIL) (2.5 MG/3ML) 0.083% nebulizer solution  Lysbeth Penner FNP

## 2013-02-25 ENCOUNTER — Ambulatory Visit (HOSPITAL_BASED_OUTPATIENT_CLINIC_OR_DEPARTMENT_OTHER): Payer: Medicare HMO | Attending: Emergency Medicine | Admitting: Radiology

## 2013-02-25 DIAGNOSIS — G4734 Idiopathic sleep related nonobstructive alveolar hypoventilation: Secondary | ICD-10-CM | POA: Insufficient documentation

## 2013-02-25 DIAGNOSIS — J449 Chronic obstructive pulmonary disease, unspecified: Secondary | ICD-10-CM | POA: Insufficient documentation

## 2013-02-25 DIAGNOSIS — J4489 Other specified chronic obstructive pulmonary disease: Secondary | ICD-10-CM | POA: Insufficient documentation

## 2013-02-25 DIAGNOSIS — Z79899 Other long term (current) drug therapy: Secondary | ICD-10-CM | POA: Insufficient documentation

## 2013-02-25 DIAGNOSIS — I498 Other specified cardiac arrhythmias: Secondary | ICD-10-CM | POA: Insufficient documentation

## 2013-02-25 DIAGNOSIS — G4733 Obstructive sleep apnea (adult) (pediatric): Secondary | ICD-10-CM | POA: Insufficient documentation

## 2013-02-26 DIAGNOSIS — R0902 Hypoxemia: Secondary | ICD-10-CM

## 2013-02-26 NOTE — Sleep Study (Signed)
[place]  NAME: Matthew Franklin DATE OF BIRTH:  12-21-1934 MEDICAL RECORD NUMBER [id number]  [facility]  PHYSICIAN: Chesley Mires, M.D. DATE OF STUDY: 02/25/2013  SLEEP STUDY TYPE: Nocturnal Polysomnogram               REFERRING PHYSICIAN: Collene Gobble, MD  INDICATION FOR STUDY:  79 yo male with snoring, sleep disruption and daytime sleepiness.  He has history of COPD and nocturnal hypoxia.  He is referred to the sleep lab for evaluation of hypersomnia with obstructive sleep apnea.  EPWORTH SLEEPINESS SCORE: 5. HEIGHT: 5\' 7"  WEIGHT: 214 lbs   BMI: 33.60  NECK SIZE: 17.5 in.  MEDICATIONS:  Current Outpatient Prescriptions on File Prior to Visit  Medication Sig Dispense Refill  . Aclidinium Bromide (TUDORZA PRESSAIR) 400 MCG/ACT AEPB Inhale 400 mcg into the lungs 2 (two) times daily.  1 each  11  . albuterol (PROVENTIL HFA;VENTOLIN HFA) 108 (90 BASE) MCG/ACT inhaler Inhale 2 puffs into the lungs every 4 (four) hours as needed for wheezing or shortness of breath.  1 Inhaler  5  . albuterol (PROVENTIL) (2.5 MG/3ML) 0.083% nebulizer solution Take 3 mLs (2.5 mg total) by nebulization every 6 (six) hours as needed for wheezing or shortness of breath.  150 mL  3  . ALPRAZolam (XANAX) 0.5 MG tablet Take 1 tablet (0.5 mg total) by mouth 2 (two) times daily as needed for anxiety.  30 tablet  3  . aspirin 81 MG tablet Take 81 mg by mouth daily.      . betamethasone dipropionate (DIPROLENE) 0.05 % cream APPLY TWICE DAILY TO AFFECTED AREA  15 g  2  . calcium-vitamin D (OSCAL) 250-125 MG-UNIT per tablet Take 1 tablet by mouth daily.      . Fluticasone-Salmeterol (ADVAIR DISKUS) 250-50 MCG/DOSE AEPB Inhale 1 puff into the lungs 2 (two) times daily.  60 each  11  . folic acid (FOLVITE) 875 MCG tablet Take 400 mcg by mouth daily.      Marland Kitchen HYDROcodone-homatropine (HYCODAN) 5-1.5 MG/5ML syrup Take 5 mLs by mouth every 6 (six) hours as needed for  cough.  120 mL  0  . levofloxacin (LEVAQUIN) 500 MG tablet Take 1 tablet (500 mg total) by mouth daily.  14 tablet  0  . loratadine (CLARITIN) 10 MG tablet Take 10 mg by mouth daily.      Marland Kitchen losartan-hydrochlorothiazide (HYZAAR) 50-12.5 MG per tablet Take 1 tablet by mouth daily.  30 tablet  11  . meclizine (ANTIVERT) 25 MG tablet Take 1 tablet (25 mg total) by mouth 3 (three) times daily as needed for dizziness.  30 tablet  3  . metoprolol (LOPRESSOR) 50 MG tablet Take 1 tablet (50 mg total) by mouth daily.  30 tablet  11  . omeprazole (PRILOSEC) 20 MG capsule Take 1 capsule (20 mg total) by mouth daily.  30 capsule  11  . predniSONE (DELTASONE) 10 MG tablet Take 4 po qd x 3 days and then 3 po qd x 3 days then 2 po qd x 3 days then one po qd x 3 days then stop  30 tablet  0  . simvastatin (ZOCOR) 40 MG tablet Take 1 tablet (40 mg total) by mouth at bedtime.  90 tablet  3   No current facility-administered medications on file prior to visit.    SLEEP ARCHITECTURE:  Total recording time: 441.5 minutes.  Total sleep time was: 319.5 minutes.  Sleep efficiency: 72.4%.  Sleep latency: 23.5 minutes.  REM latency: 71.5 minutes.  Stage N1: 6.9%.  Stage N2: 60.4%.  Stage N3: 10.5%.  Stage R:  22.2%.  Supine sleep: 0 minutes.  Non-supine sleep: 319.5 minutes.  RESPIRATORY DATA: Average respiratory rate: 19. Snoring: Loud. Average AHI: 13.9.   Apnea index: 2.6.  Hypopnea index: 11.3. Obstructive apnea index: 2.4.  Central apnea index: 0.  Mixed apnea index: 0. REM AHI: 42.3.  NREM AHI: 5.8. Supine AHI: NA. Non-supine AHI: 13.9.  OXYGEN DATA:  Baseline oxygenation: 93%. Lowest SaO2: 70%. Time spent below SaO2 90%: 21.9 minutes. Supplemental oxygen used: None.  CARDIAC DATA:  Average heart rate: 75 beats per minute. Rhythm strip: sinus rhythm, sinus arrhythmia, and occasional PVC's.  MOVEMENT/PARASOMNIA:  Periodic limb movement: 26.1.  Period limb movements with arousals: 0.4. Restroom  trips: 1.  IMPRESSION/ RECOMMENDATION:   This study shows overall mild obstructive sleep apnea.  He had a significant REM affect.  He was not observed in supine sleep.   Additional therapies include weight loss, CPAP, oral appliance, or surgical evaluation.  If he proceeds with CPAP set up, given history of COPD, I recommend he have an in-lab titration study as opposed to outpatient auto CPAP titration set up.   Chesley Mires, M.D. Diplomate, Tax adviser of Sleep Medicine  ELECTRONICALLY SIGNED ON:  02/26/2013, 8:24 AM [place] PH: [phone]   FX: [phone] [place]

## 2013-03-12 ENCOUNTER — Ambulatory Visit (INDEPENDENT_AMBULATORY_CARE_PROVIDER_SITE_OTHER): Payer: Medicare HMO | Admitting: Emergency Medicine

## 2013-03-12 ENCOUNTER — Encounter: Payer: Self-pay | Admitting: Emergency Medicine

## 2013-03-12 VITALS — BP 130/80 | HR 83 | Ht 65.0 in | Wt 216.0 lb

## 2013-03-12 DIAGNOSIS — J4489 Other specified chronic obstructive pulmonary disease: Secondary | ICD-10-CM

## 2013-03-12 DIAGNOSIS — J449 Chronic obstructive pulmonary disease, unspecified: Secondary | ICD-10-CM

## 2013-03-12 DIAGNOSIS — G4733 Obstructive sleep apnea (adult) (pediatric): Secondary | ICD-10-CM | POA: Insufficient documentation

## 2013-03-12 NOTE — Progress Notes (Signed)
Subjective:    Patient ID: Matthew Franklin, male    DOB: 23-Feb-1934, 79 y.o.   MRN: [id number]  HPI 79 yo former smoker, hx HTN, hyperlipidemia. Dx COPD in 2012 when he had an AE-COPD. He was hospitalized again early 1/'15 for apparent AE-COPD, was noted to have nocturnal hypoxemia. He is currently on Advair bid and Tudorza bid + albuterol nebs > uses it about 2x a day. He received prevnar 13 at Dr Gaston Islam office 01/16/13. His wife understands the meds better than he does.   At baseline his breathing is good. Exertional tolerance > able to walk a mile with his wife. He hears wheeze espec at night. Some cough but not significant. He has consistent PND, also has GERD. He is on loratadine and omeprazole.   ROV 03/12/13 -- follows for COPD and suspected OSA. He had PSG that showed an AHI of 13.9 on 02/26/13. He did not get [place] last month because of the cost increase. He remains on Advair.   Review of Systems  Constitutional: Negative for fever and unexpected weight change.  HENT: Positive for congestion, postnasal drip, sinus pressure and sore throat. Negative for dental problem, ear pain, nosebleeds, rhinorrhea, sneezing and trouble swallowing.   Eyes: Negative for redness and itching.  Respiratory: Positive for cough, shortness of breath and wheezing. Negative for chest tightness.   Cardiovascular: Negative for palpitations and leg swelling.  Gastrointestinal: Negative for nausea and vomiting.  Genitourinary: Negative for dysuria.  Musculoskeletal: Negative for joint swelling.  Skin: Negative for rash.  Neurological: Negative for headaches.  Hematological: Does not bruise/bleed easily.  Psychiatric/Behavioral: Negative for dysphoric mood. The patient is nervous/anxious.    Past Medical History  Diagnosis Date  . COPD (chronic obstructive pulmonary disease)   . Hypertension   . Hyperlipidemia   . Vertigo      Family History  Problem Relation Age of Onset  . Healthy Mother   .  Cancer Father   . Healthy Sister   . Cancer Brother   . Healthy Sister   . Cancer Brother      History   Social History  . Marital Status: Divorced    Spouse Name: N/A    Number of Children: N/A  . Years of Education: N/A   Occupational History  . Not on file.   Social History Main Topics  . Smoking status: Former Smoker -- 1.00 packs/day for 20 years    Types: Cigarettes    Quit date: 11/19/1997  . Smokeless tobacco: Not on file  . Alcohol Use: Yes  . Drug Use: No  . Sexual Activity: Not on file   Other Topics Concern  . Not on file   Social History Narrative  . 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 Lipitor [Atorvastatin]      Outpatient Prescriptions Prior to Visit  Medication Sig Dispense Refill  . albuterol (PROVENTIL HFA;VENTOLIN HFA) 108 (90 BASE) MCG/ACT inhaler Inhale 2 puffs into the lungs every 4 (four) hours as needed for wheezing or shortness of breath.  1 Inhaler  5  . albuterol (PROVENTIL) (2.5 MG/3ML) 0.083% nebulizer solution Take 3 mLs (2.5 mg total) by nebulization every 6 (six) hours as needed for wheezing or shortness of breath.  150 mL  3  . ALPRAZolam (XANAX) 0.5 MG tablet Take 1 tablet (0.5 mg total) by mouth 2 (two) times daily  as needed for anxiety.  30 tablet  3  . aspirin 81 MG tablet Take 81 mg by mouth daily.      . betamethasone dipropionate (DIPROLENE) 0.05 % cream APPLY TWICE DAILY TO AFFECTED AREA  15 g  2  . calcium-vitamin D (OSCAL) 250-125 MG-UNIT per tablet Take 1 tablet by mouth daily.      . Fluticasone-Salmeterol (ADVAIR DISKUS) 250-50 MCG/DOSE AEPB Inhale 1 puff into the lungs 2 (two) times daily.  60 each  11  . folic acid (FOLVITE) 791 MCG tablet Take 400 mcg by mouth daily.      Marland Kitchen loratadine (CLARITIN) 10 MG tablet Take 10 mg by mouth daily.      Marland Kitchen losartan-hydrochlorothiazide (HYZAAR) 50-12.5 MG per tablet Take 1 tablet  by mouth daily.  30 tablet  11  . meclizine (ANTIVERT) 25 MG tablet Take 1 tablet (25 mg total) by mouth 3 (three) times daily as needed for dizziness.  30 tablet  3  . metoprolol (LOPRESSOR) 50 MG tablet Take 1 tablet (50 mg total) by mouth daily.  30 tablet  11  . omeprazole (PRILOSEC) 20 MG capsule Take 1 capsule (20 mg total) by mouth daily.  30 capsule  11  . simvastatin (ZOCOR) 40 MG tablet Take 1 tablet (40 mg total) by mouth at bedtime.  90 tablet  3  . Aclidinium Bromide (TUDORZA PRESSAIR) 400 MCG/ACT AEPB Inhale 400 mcg into the lungs 2 (two) times daily.  1 each  11  . HYDROcodone-homatropine (HYCODAN) 5-1.5 MG/5ML syrup Take 5 mLs by mouth every 6 (six) hours as needed for cough.  120 mL  0  . levofloxacin (LEVAQUIN) 500 MG tablet Take 1 tablet (500 mg total) by mouth daily.  14 tablet  0  . predniSONE (DELTASONE) 10 MG tablet Take 4 po qd x 3 days and then 3 po qd x 3 days then 2 po qd x 3 days then one po qd x 3 days then stop  30 tablet  0   No facility-administered medications prior to visit.         Objective:   Physical Exam Filed Vitals:   03/12/13 1353  BP: 130/80  Pulse: 83  Height: 5\' 5"  (1.651 m)  Weight: 216 lb (97.977 kg)  SpO2: 93%   Gen: Pleasant, obese, in no distress,  normal affect  ENT: No lesions,  mouth clear,  oropharynx clear, no postnasal drip, significantly decreased hearing  Neck: No JVD, no TMG, no carotid bruits  Lungs: No use of accessory muscles, distant, clear without rales or rhonchi  Cardiovascular: RRR, heart sounds normal, no murmur or gallops, no peripheral edema  Musculoskeletal: No deformities, no cyanosis or clubbing  Neuro: alert, non focal  Skin: Warm, no lesions or rashes       Assessment & Plan:  OSA (obstructive sleep apnea) - will set up auto-titration device - after he gets used to CPAP then will perform an overnight oximetry.   COPD (chronic obstructive pulmonary disease) PFT confirm severe AFL.  - continue  Advair - investigate Tudorza and Spiriva costs and assistance.

## 2013-03-12 NOTE — Addendum Note (Signed)
Addended by: Carlos American A on: 03/12/2013 03:09 PM   Modules accepted: Orders

## 2013-03-12 NOTE — Patient Instructions (Signed)
We will start an auto-titrating CPAP machine at night After you are used to the CPAP we will check your overnight oxygen test Continue Advair We will investigate the cost of the Tudorza and decide whether an alternative would be more cost effective Follow with Dr Lamonte Sakai in 3 months or sooner if you have any problems.

## 2013-03-12 NOTE — Assessment & Plan Note (Signed)
-   will set up auto-titration device - after he gets used to CPAP then will perform an overnight oximetry.

## 2013-03-12 NOTE — Assessment & Plan Note (Signed)
PFT confirm severe AFL.  - continue Advair - investigate Tudorza and Spiriva costs and assistance.

## 2013-03-12 NOTE — Progress Notes (Signed)
PFT done today. 

## 2013-03-13 ENCOUNTER — Telehealth: Payer: Self-pay | Admitting: Emergency Medicine

## 2013-03-13 NOTE — Telephone Encounter (Signed)
Per OV 03/12/13: Patient Instructions      We will start an auto-titrating CPAP machine at night After you are used to the CPAP we will check your overnight oxygen test Continue Advair We will investigate the cost of the Tudorza and decide whether an alternative would be more cost effective Follow with Dr Lamonte Sakai in 3 months or sooner if you have any problems.  ---  Spoke with [facility]. [facility] according to [facility] pt already has O2.  [facility] has report for ONO done by Dr. Baird Cancer on 01/15/13. He qualified for O2.  Please advise RB thanks

## 2013-03-17 NOTE — Telephone Encounter (Signed)
I know he already has home O2. Once he is on CPAP he may not require anymore. That's why I want to get him on stable CPAP and then recheck his ONO on CPAP alone.

## 2013-03-18 NOTE — Telephone Encounter (Signed)
Spoke to carol she will take care of this ono once he is settled with cpap  Joellen Jersey

## 2013-05-20 ENCOUNTER — Encounter: Payer: Self-pay | Admitting: Family Medicine

## 2013-05-20 ENCOUNTER — Ambulatory Visit (INDEPENDENT_AMBULATORY_CARE_PROVIDER_SITE_OTHER): Payer: Medicare HMO | Admitting: Family Medicine

## 2013-05-20 VITALS — BP 132/79 | HR 62 | Temp 98.4°F | Ht 66.0 in | Wt 216.4 lb

## 2013-05-20 DIAGNOSIS — I10 Essential (primary) hypertension: Secondary | ICD-10-CM

## 2013-05-20 DIAGNOSIS — R5381 Other malaise: Secondary | ICD-10-CM

## 2013-05-20 DIAGNOSIS — Z139 Encounter for screening, unspecified: Secondary | ICD-10-CM

## 2013-05-20 DIAGNOSIS — E785 Hyperlipidemia, unspecified: Secondary | ICD-10-CM

## 2013-05-20 DIAGNOSIS — R5383 Other fatigue: Secondary | ICD-10-CM

## 2013-05-20 DIAGNOSIS — N529 Male erectile dysfunction, unspecified: Secondary | ICD-10-CM

## 2013-05-20 LAB — POCT CBC
Granulocyte percent: 66.8 %G (ref 37–80)
HCT, POC: 45.5 % (ref 43.5–53.7)
Hemoglobin: 14.8 g/dL (ref 14.1–18.1)
Lymph, poc: 1.9 (ref 0.6–3.4)
MCH, POC: 33.2 pg — AB (ref 27–31.2)
MCHC: 32.5 g/dL (ref 31.8–35.4)
MCV: 102.2 fL — AB (ref 80–97)
MPV: 7.6 fL (ref 0–99.8)
POC Granulocyte: 4.3 (ref 2–6.9)
POC LYMPH PERCENT: 30.3 %L (ref 10–50)
Platelet Count, POC: 266 10*3/uL (ref 142–424)
RBC: 4.5 M/uL — AB (ref 4.69–6.13)
RDW, POC: 12.5 %
WBC: 6.4 10*3/uL (ref 4.6–10.2)

## 2013-05-20 MED ORDER — SILDENAFIL CITRATE 100 MG PO TABS: 50.0000 mg | ORAL_TABLET | Freq: Every day | ORAL | Status: AC | PRN

## 2013-05-20 NOTE — Progress Notes (Signed)
   Subjective:    Patient ID: Matthew Franklin, male    DOB: 07-Feb-1934, 79 y.o.   MRN: [id number]  HPI This 79 y.o. male presents for evaluation of routine follow up.  He has hx of hypertension, hyperlipidemia, and copd.  He has no acute problems.  He has hx of ED and would like to try viagra rx.   Review of Systems    No chest pain, SOB, HA, dizziness, vision change, N/V, diarrhea, constipation, dysuria, urinary urgency or frequency, myalgias, arthralgias or rash.  Objective:   Physical Exam Vital signs noted  Well developed well nourished male.  HEENT - Head atraumatic Normocephalic                Eyes - PERRLA, Conjuctiva - clear Sclera- Clear EOMI                Ears - EAC's Wnl TM's Wnl Gross Hearing WNL                Throat - oropharanx wnl Respiratory - Lungs CTA bilateral Cardiac - RRR S1 and S2 without murmur GI - Abdomen soft Nontender and bowel sounds active x 4 Extremities - No edema. Neuro - Grossly intact.       Assessment & Plan:  Hyperlipemia - Plan: Lipid panel  Hypertension - Plan: POCT CBC, CMP14+EGFR  Screening - Plan: CANCELED: PSA, total and free  Fatigue - Plan: POCT CBC, Vit D  25 hydroxy (rtn osteoporosis monitoring)  ED (erectile dysfunction) - Plan: sildenafil (VIAGRA) 100 MG tablet  Follow up in 6 months  Lysbeth Penner FNP

## 2013-05-21 ENCOUNTER — Other Ambulatory Visit: Payer: Self-pay | Admitting: Family Medicine

## 2013-05-21 LAB — CMP14+EGFR
ALT: 14 IU/L (ref 0–44)
AST: 12 IU/L (ref 0–40)
Albumin/Globulin Ratio: 2 (ref 1.1–2.5)
Albumin: 4.3 g/dL (ref 3.5–4.8)
Alkaline Phosphatase: 44 IU/L (ref 39–117)
BUN/Creatinine Ratio: 19 (ref 10–22)
BUN: 17 mg/dL (ref 8–27)
CO2: 26 mmol/L (ref 18–29)
Calcium: 9.1 mg/dL (ref 8.6–10.2)
Chloride: 100 mmol/L (ref 97–108)
Creatinine, Ser: 0.89 mg/dL (ref 0.76–1.27)
GFR calc Af Amer: 95 mL/min/{1.73_m2} (ref >59)
GFR calc non Af Amer: 82 mL/min/{1.73_m2} (ref >59)
Globulin, Total: 2.2 g/dL (ref 1.5–4.5)
Glucose: 116 mg/dL — ABNORMAL HIGH (ref 65–99)
Potassium: 4.4 mmol/L (ref 3.5–5.2)
Sodium: 140 mmol/L (ref 134–144)
Total Bilirubin: 0.5 mg/dL (ref 0.0–1.2)
Total Protein: 6.5 g/dL (ref 6.0–8.5)

## 2013-05-21 LAB — LIPID PANEL
Chol/HDL Ratio: 3.2 ratio units (ref 0.0–5.0)
Cholesterol, Total: 166 mg/dL (ref 100–199)
HDL: 52 mg/dL (ref >39)
LDL Calculated: 90 mg/dL (ref 0–99)
Triglycerides: 119 mg/dL (ref 0–149)
VLDL Cholesterol Cal: 24 mg/dL (ref 5–40)

## 2013-05-21 LAB — VITAMIN D 25 HYDROXY (VIT D DEFICIENCY, FRACTURES): Vit D, 25-Hydroxy: 23.7 ng/mL — ABNORMAL LOW (ref 30.0–100.0)

## 2013-05-21 MED ORDER — VITAMIN D (ERGOCALCIFEROL) 1.25 MG (50000 UNIT) PO CAPS: 50000.0000 [IU] | ORAL_CAPSULE | ORAL | Status: DC

## 2013-06-13 ENCOUNTER — Encounter: Payer: Self-pay | Admitting: Emergency Medicine

## 2013-06-13 ENCOUNTER — Ambulatory Visit (INDEPENDENT_AMBULATORY_CARE_PROVIDER_SITE_OTHER): Payer: Medicare HMO | Admitting: Emergency Medicine

## 2013-06-13 VITALS — BP 130/82 | HR 69 | Ht 67.0 in | Wt 221.2 lb

## 2013-06-13 DIAGNOSIS — G4733 Obstructive sleep apnea (adult) (pediatric): Secondary | ICD-10-CM

## 2013-06-13 DIAGNOSIS — J449 Chronic obstructive pulmonary disease, unspecified: Secondary | ICD-10-CM

## 2013-06-13 NOTE — Patient Instructions (Signed)
Continue your Advair twice a day Use albuterol nebulizer or inhaler if needed for shortness of breath Continue your CPAP every night Follow with Dr Lamonte Sakai in 6 months or sooner if you have any problems

## 2013-06-13 NOTE — Addendum Note (Signed)
Addended by: Carlos American A on: 06/13/2013 10:04 AM   Modules accepted: Orders

## 2013-06-13 NOTE — Assessment & Plan Note (Signed)
Continue CPAP qhs 

## 2013-06-13 NOTE — Progress Notes (Signed)
   Subjective:    Patient ID: Matthew Franklin, male    DOB: 1934/03/31, 79 y.o.   MRN: [id number]  HPI 79 yo former smoker, hx HTN, hyperlipidemia. Dx COPD in 2012 when he had an AE-COPD. He was hospitalized again early 1/'15 for apparent AE-COPD, was noted to have nocturnal hypoxemia. He is currently on Advair bid and Tudorza bid + albuterol nebs > uses it about 2x a day. He received prevnar 13 at Dr Gaston Islam office 01/16/13. His wife understands the meds better than he does.   At baseline his breathing is good. Exertional tolerance > able to walk a mile with his wife. He hears wheeze espec at night. Some cough but not significant. He has consistent PND, also has GERD. He is on loratadine and omeprazole.   ROV 03/12/13 -- follows for COPD and suspected OSA. He had PSG that showed an AHI of 13.9 on 02/26/13. He did not get [place] last month because of the cost increase. He remains on Advair.   ROV 06/13/13 -- follow up for COPD and OSA. He is now on CPAP and is tolerating. No longer dozes or naps. He never started [place], is tolerating Advair. No SABA use, very rare.   Review of Systems  Constitutional: Negative for fever and unexpected weight change.  HENT: Positive for congestion, postnasal drip, sinus pressure and sore throat. Negative for dental problem, ear pain, nosebleeds, rhinorrhea, sneezing and trouble swallowing.   Eyes: Negative for redness and itching.  Respiratory: Positive for cough, shortness of breath and wheezing. Negative for chest tightness.   Cardiovascular: Negative for palpitations and leg swelling.  Gastrointestinal: Negative for nausea and vomiting.  Genitourinary: Negative for dysuria.  Musculoskeletal: Negative for joint swelling.  Skin: Negative for rash.  Neurological: Negative for headaches.  Hematological: Does not bruise/bleed easily.  Psychiatric/Behavioral: Negative for dysphoric mood. The patient is nervous/anxious.        Objective:   Physical  Exam Filed Vitals:   06/13/13 0925  BP: 130/82  Pulse: 69  Height: 5\' 7"  (1.702 m)  Weight: 221 lb 3.2 oz (100.336 kg)  SpO2: 95%   Gen: Pleasant, obese, in no distress,  normal affect  ENT: No lesions,  mouth clear,  oropharynx clear, no postnasal drip, significantly decreased hearing  Neck: No JVD, no TMG, no carotid bruits  Lungs: No use of accessory muscles, distant, clear without rales or rhonchi  Cardiovascular: RRR, heart sounds normal, no murmur or gallops, no peripheral edema  Musculoskeletal: No deformities, no cyanosis or clubbing  Neuro: alert, non focal  Skin: Warm, no lesions or rashes       Assessment & Plan:  OSA (obstructive sleep apnea) Continue CPAP qhs  COPD (chronic obstructive pulmonary disease) Stable on Advair - improved exercise tolerance.  - continue the advair - will reconsider aditional therapy in the future, possibly Anoro - walking oximetry today. If he does not desat will d/c his home O2 - rov6

## 2013-06-13 NOTE — Assessment & Plan Note (Signed)
Stable on Advair - improved exercise tolerance.  - continue the advair - will reconsider aditional therapy in the future, possibly Anoro - walking oximetry today. If he does not desat will d/c his home O2 - rov6

## 2013-07-01 ENCOUNTER — Encounter: Payer: Self-pay | Admitting: Family Medicine

## 2013-07-01 ENCOUNTER — Ambulatory Visit (INDEPENDENT_AMBULATORY_CARE_PROVIDER_SITE_OTHER): Payer: Medicare HMO

## 2013-07-01 ENCOUNTER — Ambulatory Visit (INDEPENDENT_AMBULATORY_CARE_PROVIDER_SITE_OTHER): Payer: Medicare HMO | Admitting: Family Medicine

## 2013-07-01 VITALS — BP 129/70 | HR 60 | Temp 97.2°F | Ht 66.0 in | Wt 220.6 lb

## 2013-07-01 DIAGNOSIS — M79672 Pain in left foot: Secondary | ICD-10-CM

## 2013-07-01 DIAGNOSIS — M79609 Pain in unspecified limb: Secondary | ICD-10-CM

## 2013-07-01 DIAGNOSIS — M10472 Other secondary gout, left ankle and foot: Secondary | ICD-10-CM

## 2013-07-01 DIAGNOSIS — M109 Gout, unspecified: Secondary | ICD-10-CM

## 2013-07-01 LAB — POCT CBC
Granulocyte percent: 68 %G (ref 37–80)
HCT, POC: 43.3 % — AB (ref 43.5–53.7)
Hemoglobin: 14.3 g/dL (ref 14.1–18.1)
Lymph, poc: 2.1 (ref 0.6–3.4)
MCH, POC: 33 pg — AB (ref 27–31.2)
MCHC: 33 g/dL (ref 31.8–35.4)
MCV: 100.1 fL — AB (ref 80–97)
MPV: 8 fL (ref 0–99.8)
POC Granulocyte: 5 (ref 2–6.9)
POC LYMPH PERCENT: 29 %L (ref 10–50)
Platelet Count, POC: 256 10*3/uL (ref 142–424)
RBC: 4.3 M/uL — AB (ref 4.69–6.13)
RDW, POC: 12.1 %
WBC: 7.3 10*3/uL (ref 4.6–10.2)

## 2013-07-01 MED ORDER — INDOMETHACIN ER 75 MG PO CPCR: 75.0000 mg | ORAL_CAPSULE | Freq: Two times a day (BID) | ORAL | Status: DC

## 2013-07-01 MED ORDER — COLCHICINE 0.6 MG PO TABS: ORAL_TABLET | ORAL | Status: DC

## 2013-07-01 NOTE — Progress Notes (Signed)
   Subjective:    Patient ID: Matthew Franklin, male    DOB: [birth year], 79 y.o.   MRN: [id number]  HPI C/o left heel pain for 2-3 days now.   The heel is tender and red. He had intial tenderness and a knot developed and then it is worse.  He has hx of gout.  He has had no injury.   Review of Systems    No chest pain, SOB, HA, dizziness, vision change, N/V, diarrhea, constipation, dysuria, urinary urgency or frequency, myalgias, arthralgias or rash.  Objective:   Physical Exam  Vital signs noted  Well developed well nourished male.  HEENT - Head atraumatic Normocephalic Respiratory - Lungs CTA bilateral Cardiac - RRR S1 and S2 without murmur  TTP left heel and plantar fascia left foot     Assessment & Plan:  Heel pain, left - Plan: DG Os [place]cis Left, POCT CBC, Uric acid, BMP8+EGFR, colchicine 0.6 MG tablet, indomethacin (INDOCIN SR) 75 MG CR capsule  Other secondary gout of left foot - Plan: POCT CBC, Uric acid, BMP8+EGFR, colchicine 0.6 MG tablet, indomethacin (INDOCIN SR) 75 MG CR capsule  Use frozen water bottles to tx and stretch left foot.  Follow up prn if sx's persist or continue.  Lysbeth Penner FNP

## 2013-07-02 LAB — BMP8+EGFR
BUN/Creatinine Ratio: 17 (ref 10–22)
BUN: 17 mg/dL (ref 8–27)
CO2: 25 mmol/L (ref 18–29)
Calcium: 9.2 mg/dL (ref 8.6–10.2)
Chloride: 99 mmol/L (ref 97–108)
Creatinine, Ser: 1.01 mg/dL (ref 0.76–1.27)
GFR calc Af Amer: 83 mL/min/{1.73_m2} (ref >59)
GFR calc non Af Amer: 71 mL/min/{1.73_m2} (ref >59)
Glucose: 103 mg/dL — ABNORMAL HIGH (ref 65–99)
Potassium: 5 mmol/L (ref 3.5–5.2)
Sodium: 138 mmol/L (ref 134–144)

## 2013-07-02 LAB — URIC ACID: Uric Acid: 6.8 mg/dL (ref 3.7–8.6)

## 2013-07-23 LAB — PULMONARY FUNCTION TEST
DL/VA % pred: 107 %
DL/VA: 4.54 ml/min/mmHg/L
DLCO unc % pred: 89 %
DLCO unc: 23.02 ml/min/mmHg
FEF 25-75 Post: 0.56 L/sec
FEF 25-75 Pre: 0.56 L/sec
FEF2575-%Change-Post: 0 %
FEF2575-%Pred-Post: 33 %
FEF2575-%Pred-Pre: 33 %
FEV1-%Change-Post: -2 %
FEV1-%Pred-Post: 52 %
FEV1-%Pred-Pre: 53 %
FEV1-Post: 1.24 L
FEV1-Pre: 1.26 L
FEV1FVC-%Change-Post: -8 %
FEV1FVC-%Pred-Pre: 74 %
FEV6-%Change-Post: 5 %
FEV6-%Pred-Post: 77 %
FEV6-%Pred-Pre: 73 %
FEV6-Post: 2.4 L
FEV6-Pre: 2.29 L
FEV6FVC-%Change-Post: -1 %
FEV6FVC-%Pred-Post: 102 %
FEV6FVC-%Pred-Pre: 104 %
FVC-%Change-Post: 6 %
FVC-%Pred-Post: 75 %
FVC-%Pred-Pre: 70 %
FVC-Post: 2.51 L
FVC-Pre: 2.36 L
Post FEV1/FVC ratio: 49 %
Post FEV6/FVC ratio: 96 %
Pre FEV1/FVC ratio: 54 %
Pre FEV6/FVC Ratio: 97 %
RV % pred: 174 %
RV: 4.06 L
TLC % pred: 108 %
TLC: 6.55 L

## 2013-08-25 ENCOUNTER — Other Ambulatory Visit: Payer: Self-pay | Admitting: Family Medicine

## 2013-08-26 NOTE — Telephone Encounter (Signed)
Patient last seen in office on 07-01-13. Rx last filled on 04-23-13 for #90. Please advise. If approved please route to Pool A so nurse can phone in to pharmacy

## 2013-08-27 NOTE — Telephone Encounter (Signed)
Sees Bill O, needs xanax today, last filled 07/23/13, last seen 07/01/13. Route to pool , call into [facility]

## 2013-08-27 NOTE — Telephone Encounter (Signed)
Called to [facility]. 

## 2013-08-27 NOTE — Telephone Encounter (Signed)
This is okay refill x1

## 2013-10-22 ENCOUNTER — Other Ambulatory Visit: Payer: Self-pay | Admitting: Family Medicine

## 2013-10-30 ENCOUNTER — Ambulatory Visit (INDEPENDENT_AMBULATORY_CARE_PROVIDER_SITE_OTHER): Payer: Medicare HMO

## 2013-10-30 DIAGNOSIS — Z23 Encounter for immunization: Secondary | ICD-10-CM

## 2013-11-18 ENCOUNTER — Encounter (INDEPENDENT_AMBULATORY_CARE_PROVIDER_SITE_OTHER): Payer: Self-pay

## 2013-11-18 ENCOUNTER — Ambulatory Visit (INDEPENDENT_AMBULATORY_CARE_PROVIDER_SITE_OTHER): Payer: Medicare HMO | Admitting: Family Medicine

## 2013-11-18 ENCOUNTER — Encounter: Payer: Self-pay | Admitting: Family Medicine

## 2013-11-18 VITALS — BP 135/71 | HR 69 | Temp 97.1°F | Ht 66.0 in | Wt 219.0 lb

## 2013-11-18 DIAGNOSIS — M10472 Other secondary gout, left ankle and foot: Secondary | ICD-10-CM

## 2013-11-18 DIAGNOSIS — M79672 Pain in left foot: Secondary | ICD-10-CM

## 2013-11-18 DIAGNOSIS — I10 Essential (primary) hypertension: Secondary | ICD-10-CM

## 2013-11-18 DIAGNOSIS — E785 Hyperlipidemia, unspecified: Secondary | ICD-10-CM

## 2013-11-18 DIAGNOSIS — K219 Gastro-esophageal reflux disease without esophagitis: Secondary | ICD-10-CM

## 2013-11-18 DIAGNOSIS — Z139 Encounter for screening, unspecified: Secondary | ICD-10-CM

## 2013-11-18 DIAGNOSIS — F411 Generalized anxiety disorder: Secondary | ICD-10-CM

## 2013-11-18 LAB — POCT CBC
Granulocyte percent: 65.1 %G (ref 37–80)
HCT, POC: 44.9 % (ref 43.5–53.7)
Hemoglobin: 15.1 g/dL (ref 14.1–18.1)
Lymph, poc: 2 (ref 0.6–3.4)
MCH, POC: 33.8 pg — AB (ref 27–31.2)
MCHC: 33.6 g/dL (ref 31.8–35.4)
MCV: 100.5 fL — AB (ref 80–97)
MPV: 8.6 fL (ref 0–99.8)
POC Granulocyte: 4.5 (ref 2–6.9)
POC LYMPH PERCENT: 29.1 %L (ref 10–50)
Platelet Count, POC: 255 10*3/uL (ref 142–424)
RBC: 4.5 M/uL — AB (ref 4.69–6.13)
RDW, POC: 11.9 %
WBC: 6.9 10*3/uL (ref 4.6–10.2)

## 2013-11-18 MED ORDER — SIMVASTATIN 40 MG PO TABS: 40.0000 mg | ORAL_TABLET | Freq: Every day | ORAL | Status: DC

## 2013-11-18 MED ORDER — OMEPRAZOLE 20 MG PO CPDR: 20.0000 mg | DELAYED_RELEASE_CAPSULE | Freq: Every day | ORAL | Status: DC

## 2013-11-18 MED ORDER — ALPRAZOLAM 0.5 MG PO TABS: 0.5000 mg | ORAL_TABLET | Freq: Two times a day (BID) | ORAL | Status: DC | PRN

## 2013-11-18 MED ORDER — INDOMETHACIN ER 75 MG PO CPCR: 75.0000 mg | ORAL_CAPSULE | Freq: Two times a day (BID) | ORAL | Status: DC

## 2013-11-18 MED ORDER — COLCHICINE 0.6 MG PO TABS: ORAL_TABLET | ORAL | Status: DC

## 2013-11-18 MED ORDER — LOSARTAN POTASSIUM-HCTZ 50-12.5 MG PO TABS: 1.0000 | ORAL_TABLET | Freq: Every day | ORAL | Status: DC

## 2013-11-18 MED ORDER — METOPROLOL TARTRATE 50 MG PO TABS: 50.0000 mg | ORAL_TABLET | Freq: Every day | ORAL | Status: DC

## 2013-11-18 NOTE — Progress Notes (Signed)
   Subjective:    Patient ID: Matthew Franklin, male    DOB: 04/09/1934, 79 y.o.   MRN: [id number]  HPI Patient is here for follow up.  He has hx of hypertension, GAD, copd, and hyperlipidemia.  He is due for labs and med refills.  Review of Systems  Constitutional: Negative for fever.  HENT: Negative for ear pain.   Eyes: Negative for discharge.  Respiratory: Negative for cough.   Cardiovascular: Negative for chest pain.  Gastrointestinal: Negative for abdominal distention.  Endocrine: Negative for polyuria.  Genitourinary: Negative for difficulty urinating.  Musculoskeletal: Negative for gait problem and neck pain.  Skin: Negative for color change and rash.  Neurological: Negative for speech difficulty and headaches.  Psychiatric/Behavioral: Negative for agitation.       Objective:    BP 135/71 mmHg  Pulse 69  Temp(Src) 97.1 F (36.2 C) (Oral)  Ht $R'5\' 6"'fH$  (1.676 m)  Wt 219 lb (99.338 kg)  BMI 35.36 kg/m2   Physical Exam  Constitutional: He is oriented to person, place, and time. He appears well-developed and well-nourished.  HENT:  Head: Normocephalic and atraumatic.  Mouth/Throat: Oropharynx is clear and moist.  Eyes: Pupils are equal, round, and reactive to light.  Neck: Normal range of motion. Neck supple.  Cardiovascular: Normal rate and regular rhythm.   No murmur heard. Pulmonary/Chest: Effort normal and breath sounds normal.  Abdominal: Soft. Bowel sounds are normal. There is no tenderness.  Neurological: He is alert and oriented to person, place, and time.  Skin: Skin is warm and dry.  Psychiatric: He has a normal mood and affect.          Assessment & Plan:     ICD-9-CM ICD-10-CM   1. Screening V82.9 Z13.9 PSA, total and free     Thyroid Panel With TSH  2. Hyperlipemia 272.4 E78.5 CMP14+EGFR     Lipid panel     simvastatin (ZOCOR) 40 MG tablet  3. Essential hypertension, benign 401.1 I10 POCT CBC     CMP14+EGFR  4. Essential hypertension 401.9 I10  losartan-hydrochlorothiazide (HYZAAR) 50-12.5 MG per tablet     metoprolol (LOPRESSOR) 50 MG tablet  5. Heel pain, left 729.5 M79.672 colchicine 0.6 MG tablet     indomethacin (INDOCIN SR) 75 MG CR capsule  6. Other secondary gout of left foot 274.00 M10.472 colchicine 0.6 MG tablet     indomethacin (INDOCIN SR) 75 MG CR capsule  7. Gastroesophageal reflux disease without esophagitis 530.81 K21.9 omeprazole (PRILOSEC) 20 MG capsule  8. GAD (generalized anxiety disorder) 300.02 F41.1 ALPRAZolam (XANAX) 0.5 MG tablet     DISCONTINUED: ALPRAZolam (XANAX) 0.5 MG tablet     Return in about 3 months (around 02/18/2014).  Lysbeth Penner FNP

## 2013-11-19 LAB — CMP14+EGFR
ALT: 23 [IU]/L (ref 0–44)
AST: 12 [IU]/L (ref 0–40)
Albumin/Globulin Ratio: 1.7 (ref 1.1–2.5)
Albumin: 4.2 g/dL (ref 3.5–4.8)
Alkaline Phosphatase: 53 [IU]/L (ref 39–117)
BUN/Creatinine Ratio: 19 (ref 10–22)
BUN: 18 mg/dL (ref 8–27)
CO2: 26 mmol/L (ref 18–29)
Calcium: 9.2 mg/dL (ref 8.6–10.2)
Chloride: 97 mmol/L (ref 97–108)
Creatinine, Ser: 0.97 mg/dL (ref 0.76–1.27)
GFR calc Af Amer: 87 mL/min/{1.73_m2}
GFR calc non Af Amer: 75 mL/min/{1.73_m2}
Globulin, Total: 2.5 g/dL (ref 1.5–4.5)
Glucose: 120 mg/dL — ABNORMAL HIGH (ref 65–99)
Potassium: 4.7 mmol/L (ref 3.5–5.2)
Sodium: 137 mmol/L (ref 134–144)
Total Bilirubin: 0.4 mg/dL (ref 0.0–1.2)
Total Protein: 6.7 g/dL (ref 6.0–8.5)

## 2013-11-19 LAB — THYROID PANEL WITH TSH
Free Thyroxine Index: 2.1 (ref 1.2–4.9)
T3 Uptake Ratio: 29 % (ref 24–39)
T4, Total: 7.2 ug/dL (ref 4.5–12.0)
TSH: 2.4 u[IU]/mL (ref 0.450–4.500)

## 2013-11-19 LAB — PSA, TOTAL AND FREE
PSA, Free Pct: 30 %
PSA, Free: 0.21 ng/mL
PSA: 0.7 ng/mL (ref 0.0–4.0)

## 2013-11-19 LAB — LIPID PANEL
Chol/HDL Ratio: 3.4 ratio (ref 0.0–5.0)
Cholesterol, Total: 178 mg/dL (ref 100–199)
HDL: 52 mg/dL
LDL Calculated: 101 mg/dL — ABNORMAL HIGH (ref 0–99)
Triglycerides: 127 mg/dL (ref 0–149)
VLDL Cholesterol Cal: 25 mg/dL (ref 5–40)

## 2013-11-20 ENCOUNTER — Telehealth: Payer: Self-pay | Admitting: *Deleted

## 2013-11-20 NOTE — Telephone Encounter (Signed)
Spoke to [place] (emergency contact) reviewed labs all look good per [facility] and advised to add MVI to his regular folic acid and vit d supplements per Bill. Vaughan Basta voiced understanding, will close call.

## 2013-11-20 NOTE — Telephone Encounter (Signed)
lmtcb regarding lab results. 

## 2013-11-20 NOTE — Telephone Encounter (Signed)
-----   Message from Lysbeth Penner, FNP sent at 11/19/2013  3:05 PM EST ----- Make sure taking MVI with folic acid and labs look good

## 2013-12-01 ENCOUNTER — Other Ambulatory Visit: Payer: Self-pay | Admitting: Family Medicine

## 2013-12-01 MED ORDER — FLUTICASONE-SALMETEROL 250-50 MCG/DOSE IN AEPB: 1.0000 | INHALATION_SPRAY | Freq: Two times a day (BID) | RESPIRATORY_TRACT | Status: DC

## 2013-12-01 NOTE — Telephone Encounter (Signed)
done

## 2014-01-05 ENCOUNTER — Other Ambulatory Visit: Payer: Self-pay | Admitting: Family Medicine

## 2014-01-06 ENCOUNTER — Ambulatory Visit (INDEPENDENT_AMBULATORY_CARE_PROVIDER_SITE_OTHER): Payer: Commercial Managed Care - HMO | Admitting: Emergency Medicine

## 2014-01-06 ENCOUNTER — Encounter: Payer: Self-pay | Admitting: Emergency Medicine

## 2014-01-06 VITALS — BP 132/62 | HR 87 | Ht 66.0 in | Wt 221.0 lb

## 2014-01-06 DIAGNOSIS — J449 Chronic obstructive pulmonary disease, unspecified: Secondary | ICD-10-CM

## 2014-01-06 DIAGNOSIS — G4733 Obstructive sleep apnea (adult) (pediatric): Secondary | ICD-10-CM

## 2014-01-06 NOTE — Assessment & Plan Note (Signed)
Doing very well clinically, especially in light of the severity of his AFL.  - continue same regimen - rov 6

## 2014-01-06 NOTE — Progress Notes (Signed)
   Subjective:    Patient ID: Matthew Franklin, male    DOB: [birth year], 80 y.o.   MRN: [id number]  HPI 80 yo former smoker, hx HTN, hyperlipidemia. Dx COPD in 2012 when he had an AE-COPD. He was hospitalized again early 1/'15 for apparent AE-COPD, was noted to have nocturnal hypoxemia. He is currently on Advair bid and Tudorza bid + albuterol nebs > uses it about 2x a day. He received prevnar 13 at Dr Gaston Islam office 01/16/13. His wife understands the meds better than he does.   At baseline his breathing is good. Exertional tolerance > able to walk a mile with his wife. He hears wheeze espec at night. Some cough but not significant. He has consistent PND, also has GERD. He is on loratadine and omeprazole.   ROV 03/12/13 -- follows for COPD and suspected OSA. He had PSG that showed an AHI of 13.9 on 02/26/13. He did not get [place] last month because of the cost increase. He remains on Advair.   ROV 06/13/13 -- follow up for COPD and OSA. He is now on CPAP and is tolerating. No longer dozes or naps. He never started [place], is tolerating Advair. No SABA use, very rare.   ROV 01/06/14 -- follow up for his COPD - he tells me that he has been doing very well. He is on Advair and tolerates well. Rarely uses albuterol. He is about to start doing a workout program. No flares since last time. He is up to date on flu shot. He also had Prevnar 13 in Fall '15. He is on CPAP with good compliance - lots of energy, does not nap.   Review of Systems  Constitutional: Negative for fever and unexpected weight change.  HENT: Positive for congestion, postnasal drip, sinus pressure and sore throat. Negative for dental problem, ear pain, nosebleeds, rhinorrhea, sneezing and trouble swallowing.   Eyes: Negative for redness and itching.  Respiratory: Positive for cough, shortness of breath and wheezing. Negative for chest tightness.   Cardiovascular: Negative for palpitations and leg swelling.  Gastrointestinal: Negative for  nausea and vomiting.  Genitourinary: Negative for dysuria.  Musculoskeletal: Negative for joint swelling.  Skin: Negative for rash.  Neurological: Negative for headaches.  Hematological: Does not bruise/bleed easily.  Psychiatric/Behavioral: Negative for dysphoric mood. The patient is nervous/anxious.         Objective:   Physical Exam Filed Vitals:   01/06/14 1703  BP: 132/62  Pulse: 87  Height: 5\' 6"  (1.676 m)  Weight: 221 lb (100.245 kg)  SpO2: 98%   Gen: Pleasant, obese, in no distress,  normal affect  ENT: No lesions,  mouth clear,  oropharynx clear, no postnasal drip, significantly decreased hearing  Neck: No JVD, no TMG, no carotid bruits  Lungs: No use of accessory muscles, distant, clear without rales or rhonchi  Cardiovascular: RRR, heart sounds normal, no murmur or gallops, no peripheral edema  Musculoskeletal: No deformities, no cyanosis or clubbing  Neuro: alert, non focal  Skin: Warm, no lesions or rashes       Assessment & Plan:  COPD (chronic obstructive pulmonary disease) Doing very well clinically, especially in light of the severity of his AFL.  - continue same regimen - rov 6  OSA (obstructive sleep apnea) - continue current CPAP. He is clearly benefiting and he is compliant

## 2014-01-06 NOTE — Patient Instructions (Signed)
Please continue your current medications Start your exercise routine as planned Wear CPAP every night.  Follow with Dr Lamonte Sakai in 6 months or sooner if you have any problems

## 2014-01-06 NOTE — Assessment & Plan Note (Signed)
-   continue current CPAP. He is clearly benefiting and he is compliant

## 2014-01-14 ENCOUNTER — Ambulatory Visit: Payer: Medicare HMO | Admitting: *Deleted

## 2014-01-14 DIAGNOSIS — Z23 Encounter for immunization: Secondary | ICD-10-CM

## 2014-01-14 NOTE — Progress Notes (Signed)
Pt given Zostavax subQ Left Upper Arm. Pt tolerated injection well.

## 2014-01-14 NOTE — Patient Instructions (Signed)
Herpes Zoster Virus Vaccine What is this medicine? HERPES ZOSTER VIRUS VACCINE (HUR peez ZOS ter vahy ruhs vak SEEN) is a vaccine. It is used to prevent shingles in adults 78 years old and over. This vaccine is not used to treat shingles or nerve pain from shingles. This medicine may be used for other purposes; ask your health care provider or pharmacist if you have questions. COMMON BRAND NAME(S): Varivax, Zostavax What should I tell my health care provider before I take this medicine? They need to know if you have any of these conditions: -cancer like leukemia or lymphoma -immune system problems or therapy -infection with fever -tuberculosis -an unusual or allergic reaction to vaccines, neomycin, gelatin, other medicines, foods, dyes, or preservatives -pregnant or trying to get pregnant -breast-feeding How should I use this medicine? This vaccine is for injection under the skin. It is given by a health care professional. Talk to your pediatrician regarding the use of this medicine in children. This medicine is not approved for use in children. Overdosage: If you think you have taken too much of this medicine contact a poison control center or emergency room at once. NOTE: This medicine is only for you. Do not share this medicine with others. What if I miss a dose? This does not apply. What may interact with this medicine? Do not take this medicine with any of the following medications: -adalimumab -anakinra -etanercept -infliximab -medicines to treat cancer -medicines that suppress your immune system This medicine may also interact with the following medications: -immunoglobulins -steroid medicines like prednisone or cortisone This list may not describe all possible interactions. Give your health care provider a list of all the medicines, herbs, non-prescription drugs, or dietary supplements you use. Also tell them if you smoke, drink alcohol, or use illegal drugs. Some items may  interact with your medicine. What should I watch for while using this medicine? Visit your doctor for regular check ups. This vaccine, like all vaccines, may not fully protect everyone. After receiving this vaccine it may be possible to pass chickenpox infection to others. Avoid people with immune system problems, pregnant women who have not had chickenpox, and newborns of women who have not had chickenpox. Talk to your doctor for more information. What side effects may I notice from receiving this medicine? Side effects that you should report to your doctor or health care professional as soon as possible: -allergic reactions like skin rash, itching or hives, swelling of the face, lips, or tongue -breathing problems -feeling faint or lightheaded, falls -fever, flu-like symptoms -pain, tingling, numbness in the hands or feet -swelling of the ankles, feet, hands -unusually weak or tired Side effects that usually do not require medical attention (report to your doctor or health care professional if they continue or are bothersome): -aches or pains -chickenpox-like rash -diarrhea -headache -loss of appetite -nausea, vomiting -redness, pain, swelling at site where injected -runny nose This list may not describe all possible side effects. Call your doctor for medical advice about side effects. You may report side effects to FDA at 1-800-FDA-1088. Where should I keep my medicine? This drug is given in a hospital or clinic and will not be stored at home. NOTE: This sheet is a summary. It may not cover all possible information. If you have questions about this medicine, talk to your doctor, pharmacist, or health care provider.  2015, Elsevier/Gold Standard. (2009-06-07 17:43:50)  

## 2014-01-25 DIAGNOSIS — G4733 Obstructive sleep apnea (adult) (pediatric): Secondary | ICD-10-CM | POA: Diagnosis not present

## 2014-02-25 DIAGNOSIS — G4733 Obstructive sleep apnea (adult) (pediatric): Secondary | ICD-10-CM | POA: Diagnosis not present

## 2014-02-26 ENCOUNTER — Encounter: Payer: Self-pay | Admitting: Family Medicine

## 2014-02-26 ENCOUNTER — Ambulatory Visit (INDEPENDENT_AMBULATORY_CARE_PROVIDER_SITE_OTHER): Payer: Commercial Managed Care - HMO | Admitting: Family Medicine

## 2014-02-26 VITALS — BP 133/69 | HR 69 | Temp 97.7°F | Ht 66.0 in | Wt 217.0 lb

## 2014-02-26 DIAGNOSIS — R42 Dizziness and giddiness: Secondary | ICD-10-CM

## 2014-02-26 DIAGNOSIS — H60393 Other infective otitis externa, bilateral: Secondary | ICD-10-CM

## 2014-02-26 DIAGNOSIS — H8303 Labyrinthitis, bilateral: Secondary | ICD-10-CM | POA: Diagnosis not present

## 2014-02-26 MED ORDER — NEOMYCIN-POLYMYXIN-HC 3.5-10000-1 OT SOLN: 3.0000 [drp] | Freq: Four times a day (QID) | OTIC | Status: DC

## 2014-02-26 MED ORDER — MECLIZINE HCL 25 MG PO TABS: 25.0000 mg | ORAL_TABLET | Freq: Three times a day (TID) | ORAL | Status: DC | PRN

## 2014-02-26 NOTE — Progress Notes (Signed)
Subjective:    Patient ID: Matthew Franklin, male    DOB: Sep 02, 1934, 80 y.o.   MRN: [id number]  HPI Patient here today for bilateral ear pain/ itch and some bleeding. He also has some congestion and a slight cough. He also complains of dizziness. He is accompanied today by his wife. The patient indicates that the cough and congestion there have been going on for 2 weeks are getting better. He does have some dizziness with pruritus in his ear and pain in the ear and there's been some blood that has been seen. The patient indicates that changing positions makes the dizziness worse. Especially getting in and out of the bed.        Patient Active Problem List   Diagnosis Date Noted  . OSA (obstructive sleep apnea) 03/12/2013  . Nocturnal hypoxemia 01/28/2013  . GERD (gastroesophageal reflux disease) 11/19/2012  . Hypertension 11/19/2012  . Hyperlipidemia LDL goal < 100 11/19/2012  . COPD (chronic obstructive pulmonary disease) 11/19/2012   Outpatient Encounter Prescriptions as of 02/26/2014  Medication Sig  . albuterol (PROVENTIL HFA;VENTOLIN HFA) 108 (90 BASE) MCG/ACT inhaler Inhale 2 puffs into the lungs every 4 (four) hours as needed for wheezing or shortness of breath.  Marland Kitchen albuterol (PROVENTIL) (2.5 MG/3ML) 0.083% nebulizer solution Take 3 mLs (2.5 mg total) by nebulization every 6 (six) hours as needed for wheezing or shortness of breath.  . ALPRAZolam (XANAX) 0.5 MG tablet Take 1 tablet (0.5 mg total) by mouth 2 (two) times daily as needed for anxiety.  Marland Kitchen aspirin 81 MG tablet Take 81 mg by mouth daily.  . betamethasone dipropionate (DIPROLENE) 0.05 % cream APPLY TWICE DAILY TO AFFECTED AREA  . Calcium Carbonate-Vitamin D (CALCIUM-VITAMIN D) 500-200 MG-UNIT per tablet Take 1 tablet by mouth daily.  . calcium-vitamin D (OSCAL) 250-125 MG-UNIT per tablet Take 1 tablet by mouth daily.  . colchicine 0.6 MG tablet Take one po bid prn gout flare  . Fluticasone-Salmeterol (ADVAIR DISKUS)  250-50 MCG/DOSE AEPB Inhale 1 puff into the lungs 2 (two) times daily.  . folic acid (FOLVITE) 782 MCG tablet Take 400 mcg by mouth daily.  . indomethacin (INDOCIN SR) 75 MG CR capsule Take 1 capsule (75 mg total) by mouth 2 (two) times daily with a meal.  . loratadine (CLARITIN) 10 MG tablet Take 10 mg by mouth daily.  Marland Kitchen losartan-hydrochlorothiazide (HYZAAR) 50-12.5 MG per tablet Take 1 tablet by mouth daily.  . meclizine (ANTIVERT) 25 MG tablet TAKE 1 TABLET (25 MG TOTAL) BY MOUTH 3 (THREE) TIMES DAILY AS NEEDED F OR DIZZINESS.  . metoprolol (LOPRESSOR) 50 MG tablet Take 1 tablet (50 mg total) by mouth daily.  . Multiple Vitamin (MULTIVITAMIN) tablet Take 1 tablet by mouth daily.  Marland Kitchen omeprazole (PRILOSEC) 20 MG capsule Take 1 capsule (20 mg total) by mouth daily.  . sildenafil (VIAGRA) 100 MG tablet Take 0.5-1 tablets (50-100 mg total) by mouth daily as needed for erectile dysfunction.  . simvastatin (ZOCOR) 40 MG tablet Take 1 tablet (40 mg total) by mouth at bedtime.  . Vitamin D, Ergocalciferol, (DRISDOL) 50000 UNITS CAPS capsule Take 1 capsule (50,000 Units total) by mouth every 7 (seven) days.    Review of Systems  Constitutional: Positive for fever (low grade ).  HENT: Positive for ear discharge (blood - left ) and ear pain.   Eyes: Negative.   Respiratory: Positive for cough (slight).   Cardiovascular: Negative.   Gastrointestinal: Negative.   Endocrine: Negative.   Genitourinary:  Negative.   Musculoskeletal: Negative.   Skin: Negative.   Allergic/Immunologic: Negative.   Neurological: Positive for dizziness.  Hematological: Negative.   Psychiatric/Behavioral: Negative.        Objective:   Physical Exam  Constitutional: He is oriented to person, place, and time. He appears well-developed and well-nourished. No distress.  HENT:  Head: Normocephalic and atraumatic.  Nose: Nose normal.  Mouth/Throat: Oropharynx is clear and moist. No oropharyngeal exudate.  Both right and  left ear canals are clear but have redness and irritation bilaterally.  Eyes: Conjunctivae and EOM are normal. Pupils are equal, round, and reactive to light. Right eye exhibits no discharge. Left eye exhibits no discharge. No scleral icterus.  Neck: Normal range of motion. Neck supple. No thyromegaly present.  Cardiovascular: Normal rate, regular rhythm and normal heart sounds.   No murmur heard. Pulmonary/Chest: Effort normal and breath sounds normal. No respiratory distress. He has no wheezes. He has no rales. He exhibits no tenderness.  The chest is clear with a dry cough.  Musculoskeletal: Normal range of motion. He exhibits no edema.  Lymphadenopathy:    He has no cervical adenopathy.  Neurological: He is alert and oriented to person, place, and time.  The patient has diminished hearing bilaterally  Skin: Skin is warm and dry. No rash noted. No erythema.  Psychiatric: He has a normal mood and affect. His behavior is normal. Judgment and thought content normal.  Nursing note and vitals reviewed.  BP 133/69 mmHg  Pulse 69  Temp(Src) 97.7 F (36.5 C) (Oral)  Ht 5\' 6"  (1.676 m)  Wt 217 lb (98.431 kg)  BMI 35.04 kg/m2        Assessment & Plan:  1. Labyrinthitis, bilateral - meclizine (ANTIVERT) 25 MG tablet; Take 1 tablet (25 mg total) by mouth 3 (three) times daily as needed for dizziness.  Dispense: 30 tablet; Refill: 2  2. Otitis, externa, infective, bilateral - neomycin-polymyxin-hydrocortisone (CORTISPORIN) otic solution; Place 3 drops into both ears 4 (four) times daily.  Dispense: 10 mL; Refill: 1  3. Dizziness and giddiness -Take the meclizine regularly for 7-10 days  Patient Instructions   Use eardrops as directed with a cotton wick Ann apply drops to the wick 4 times daily this in each ear canal, apply 3-4 drops at a time 4 times daily  use earplugs to keep the water out of the year and in insert the cotton wick with the antibiotic drops after removing the ear plug  and taking a shower.  Use meclizine for dizziness and take this regularly for the next week to 10 days. Use nasal saline to each nostril 3 or 4 times daily Use a cool mist humidifier Continue to drink plenty of fluids Use Mucinex, blue and white over-the-counter, 1 twice daily for cough and congestion with a large glass of water. Remember to avoid soaps or fabric softeners and detergents that are scented.   Arrie Senate MD

## 2014-02-26 NOTE — Patient Instructions (Addendum)
Use eardrops as directed with a cotton wick Ann apply drops to the wick 4 times daily this in each ear canal, apply 3-4 drops at a time 4 times daily  use earplugs to keep the water out of the year and in insert the cotton wick with the antibiotic drops after removing the ear plug and taking a shower.  Use meclizine for dizziness and take this regularly for the next week to 10 days. Use nasal saline to each nostril 3 or 4 times daily Use a cool mist humidifier Continue to drink plenty of fluids Use Mucinex, blue and white over-the-counter, 1 twice daily for cough and congestion with a large glass of water. Remember to avoid soaps or fabric softeners and detergents that are scented.

## 2014-03-26 DIAGNOSIS — G4733 Obstructive sleep apnea (adult) (pediatric): Secondary | ICD-10-CM | POA: Diagnosis not present

## 2014-03-30 DIAGNOSIS — G4733 Obstructive sleep apnea (adult) (pediatric): Secondary | ICD-10-CM | POA: Diagnosis not present

## 2014-05-06 ENCOUNTER — Other Ambulatory Visit: Payer: Self-pay | Admitting: Family Medicine

## 2014-05-07 NOTE — Telephone Encounter (Signed)
Last seen 02/26/14 DWM  Last filled 02/26/14 #30 2 RF's

## 2014-06-01 DIAGNOSIS — J209 Acute bronchitis, unspecified: Secondary | ICD-10-CM | POA: Diagnosis not present

## 2014-06-01 DIAGNOSIS — J449 Chronic obstructive pulmonary disease, unspecified: Secondary | ICD-10-CM | POA: Diagnosis not present

## 2014-06-15 ENCOUNTER — Other Ambulatory Visit: Payer: Self-pay | Admitting: Family Medicine

## 2014-06-23 ENCOUNTER — Other Ambulatory Visit: Payer: Self-pay

## 2014-06-23 MED ORDER — FLUTICASONE-SALMETEROL 250-50 MCG/DOSE IN AEPB: INHALATION_SPRAY | RESPIRATORY_TRACT | Status: DC

## 2014-06-30 DIAGNOSIS — G4733 Obstructive sleep apnea (adult) (pediatric): Secondary | ICD-10-CM | POA: Diagnosis not present

## 2014-07-07 ENCOUNTER — Other Ambulatory Visit: Payer: Self-pay

## 2014-07-07 DIAGNOSIS — F411 Generalized anxiety disorder: Secondary | ICD-10-CM

## 2014-07-07 MED ORDER — ALPRAZOLAM 0.5 MG PO TABS: 0.5000 mg | ORAL_TABLET | Freq: Two times a day (BID) | ORAL | Status: DC | PRN

## 2014-07-07 NOTE — Telephone Encounter (Signed)
Last seen 02/26/14 DWM  If approved route to nurse to call into [facility]

## 2014-08-10 ENCOUNTER — Other Ambulatory Visit: Payer: Self-pay | Admitting: Family Medicine

## 2014-10-01 DIAGNOSIS — G4733 Obstructive sleep apnea (adult) (pediatric): Secondary | ICD-10-CM | POA: Diagnosis not present

## 2014-11-11 ENCOUNTER — Other Ambulatory Visit: Payer: Self-pay | Admitting: *Deleted

## 2014-11-11 DIAGNOSIS — K219 Gastro-esophageal reflux disease without esophagitis: Secondary | ICD-10-CM

## 2014-11-11 DIAGNOSIS — I10 Essential (primary) hypertension: Secondary | ICD-10-CM

## 2014-11-11 MED ORDER — OMEPRAZOLE 20 MG PO CPDR: 20.0000 mg | DELAYED_RELEASE_CAPSULE | Freq: Every day | ORAL | Status: DC

## 2014-11-11 MED ORDER — METOPROLOL TARTRATE 50 MG PO TABS: 50.0000 mg | ORAL_TABLET | Freq: Every day | ORAL | Status: DC

## 2014-11-11 MED ORDER — LOSARTAN POTASSIUM-HCTZ 50-12.5 MG PO TABS: 1.0000 | ORAL_TABLET | Freq: Every day | ORAL | Status: DC

## 2014-11-16 ENCOUNTER — Ambulatory Visit (INDEPENDENT_AMBULATORY_CARE_PROVIDER_SITE_OTHER): Payer: Commercial Managed Care - HMO

## 2014-11-16 ENCOUNTER — Ambulatory Visit: Payer: Commercial Managed Care - HMO | Admitting: Family Medicine

## 2014-11-16 DIAGNOSIS — Z23 Encounter for immunization: Secondary | ICD-10-CM | POA: Diagnosis not present

## 2014-11-19 ENCOUNTER — Ambulatory Visit (INDEPENDENT_AMBULATORY_CARE_PROVIDER_SITE_OTHER): Payer: Commercial Managed Care - HMO | Admitting: Emergency Medicine

## 2014-11-19 ENCOUNTER — Encounter: Payer: Self-pay | Admitting: Emergency Medicine

## 2014-11-19 VITALS — BP 130/70 | HR 78 | Ht 67.0 in | Wt 220.0 lb

## 2014-11-19 DIAGNOSIS — J449 Chronic obstructive pulmonary disease, unspecified: Secondary | ICD-10-CM

## 2014-11-19 DIAGNOSIS — G4733 Obstructive sleep apnea (adult) (pediatric): Secondary | ICD-10-CM | POA: Diagnosis not present

## 2014-11-19 MED ORDER — FLUTICASONE PROPIONATE 50 MCG/ACT NA SUSP: 2.0000 | Freq: Every day | NASAL | Status: DC

## 2014-11-19 NOTE — Addendum Note (Signed)
Addended by: Maryanna Shape A on: 11/19/2014 04:54 PM   Modules accepted: Orders

## 2014-11-19 NOTE — Patient Instructions (Addendum)
Please continue Advair twice a day Use albuterol (either by nebulizer or HFA inhaler) as needed for shortness of breath.  Continue loratadine 10 mg daily Continue to wear CPAP every night Try starting fluticasone nasal spray, 2 sprays each nostril once a day. If she benefited from this please call our office and we will write you a generic prescription for this medication Follow with Dr Lamonte Sakai in 6 months or sooner if you have any problems

## 2014-11-19 NOTE — Assessment & Plan Note (Signed)
Please continue Advair twice a day Use albuterol (either by nebulizer or HFA inhaler) as needed for shortness of breath.  Continue loratadine 10 mg daily Try starting fluticasone nasal spray, 2 sprays each nostril once a day. If she benefited from this please call our office and we will write you a generic prescription for this medication Follow with Dr Lamonte Sakai in 6 months or sooner if you have any problems

## 2014-11-19 NOTE — Progress Notes (Signed)
Subjective:    Patient ID: Matthew Franklin, male    DOB: 05-13-1934, 80 y.o.   MRN: [id number]  HPI 80 yo former smoker, hx HTN, hyperlipidemia. Dx COPD in 2012 when he had an AE-COPD. He was hospitalized again early 1/'15 for apparent AE-COPD, was noted to have nocturnal hypoxemia. He is currently on Advair bid and Tudorza bid + albuterol nebs > uses it about 2x a day. He received prevnar 13 at Dr Gaston Islam office 01/16/13. His wife understands the meds better than he does.   At baseline his breathing is good. Exertional tolerance > able to walk a mile with his wife. He hears wheeze espec at night. Some cough but not significant. He has consistent PND, also has GERD. He is on loratadine and omeprazole.   ROV 03/12/13 -- follows for COPD and suspected OSA. He had PSG that showed an AHI of 13.9 on 02/26/13. He did not get [place] last month because of the cost increase. He remains on Advair.   ROV 06/13/13 -- follow up for COPD and OSA. He is now on CPAP and is tolerating. No longer dozes or naps. He never started [place], is tolerating Advair. No SABA use, very rare.   ROV 01/06/14 -- follow up for his COPD - he tells me that he has been doing very well. He is on Advair and tolerates well. Rarely uses albuterol. He is about to start doing a workout program. No flares since last time. He is up to date on flu shot. He also had Prevnar 13 in Fall '15. He is on CPAP with good compliance - lots of energy, does not nap.   ROV 11/19/14 -- patient follows up today for his COPD and his obstructive sleep apnea. He has been compliant with his CPAP, feels well rested. He has plenty of energy now. He is on Advair bid, uses albuterol prn, once this year. Never needs his albuterol HFA. Occasional wheeze, no cough.  He has nasal congestion, on loratadine.    CAT Score 11/19/2014  Total CAT Score 3     Review of Systems  Constitutional: Negative for fever and unexpected weight change.  HENT: Positive for  congestion and postnasal drip. Negative for dental problem, ear pain, nosebleeds, rhinorrhea, sinus pressure, sneezing, sore throat and trouble swallowing.   Eyes: Negative for redness and itching.  Respiratory: Positive for wheezing. Negative for cough, chest tightness and shortness of breath.   Cardiovascular: Negative for palpitations and leg swelling.  Gastrointestinal: Negative for nausea and vomiting.  Genitourinary: Negative for dysuria.  Musculoskeletal: Negative for joint swelling.  Skin: Negative for rash.  Neurological: Negative for headaches.  Hematological: Does not bruise/bleed easily.  Psychiatric/Behavioral: Negative for dysphoric mood. The patient is not nervous/anxious.         Objective:   Physical Exam Filed Vitals:   11/19/14 1613 11/19/14 1614  BP:  130/70  Pulse:  78  Height: 5\' 7"  (1.702 m)   Weight: 220 lb (99.791 kg)   SpO2:  97%   Gen: Pleasant, obese, in no distress,  normal affect  ENT: No lesions,  mouth clear,  oropharynx clear, no postnasal drip, significantly decreased hearing  Neck: No JVD, no TMG, no carotid bruits  Lungs: No use of accessory muscles, distant, clear without rales or rhonchi  Cardiovascular: RRR, heart sounds normal, no murmur or gallops, no peripheral edema  Musculoskeletal: No deformities, no cyanosis or clubbing  Neuro: alert, non focal  Skin: Warm, no lesions or  rashes       Assessment & Plan:  COPD (chronic obstructive pulmonary disease) Please continue Advair twice a day Use albuterol (either by nebulizer or HFA inhaler) as needed for shortness of breath.  Continue loratadine 10 mg daily Try starting fluticasone nasal spray, 2 sprays each nostril once a day. If she benefited from this please call our office and we will write you a generic prescription for this medication Follow with Dr Lamonte Sakai in 6 months or sooner if you have any problems  OSA (obstructive sleep apnea) Continue CPAP every night as  ordered

## 2014-11-19 NOTE — Assessment & Plan Note (Signed)
Continue CPAP every night as ordered

## 2014-11-20 ENCOUNTER — Other Ambulatory Visit: Payer: Self-pay | Admitting: Family Medicine

## 2014-11-25 ENCOUNTER — Ambulatory Visit (INDEPENDENT_AMBULATORY_CARE_PROVIDER_SITE_OTHER): Payer: Commercial Managed Care - HMO | Admitting: Family Medicine

## 2014-11-25 ENCOUNTER — Ambulatory Visit (INDEPENDENT_AMBULATORY_CARE_PROVIDER_SITE_OTHER): Payer: Commercial Managed Care - HMO

## 2014-11-25 VITALS — BP 129/73 | HR 64 | Temp 97.0°F | Ht 67.0 in | Wt 218.0 lb

## 2014-11-25 DIAGNOSIS — M10472 Other secondary gout, left ankle and foot: Secondary | ICD-10-CM | POA: Diagnosis not present

## 2014-11-25 DIAGNOSIS — K219 Gastro-esophageal reflux disease without esophagitis: Secondary | ICD-10-CM

## 2014-11-25 DIAGNOSIS — J431 Panlobular emphysema: Secondary | ICD-10-CM | POA: Diagnosis not present

## 2014-11-25 DIAGNOSIS — E559 Vitamin D deficiency, unspecified: Secondary | ICD-10-CM | POA: Diagnosis not present

## 2014-11-25 DIAGNOSIS — Z1212 Encounter for screening for malignant neoplasm of rectum: Secondary | ICD-10-CM

## 2014-11-25 DIAGNOSIS — I25709 Atherosclerosis of coronary artery bypass graft(s), unspecified, with unspecified angina pectoris: Secondary | ICD-10-CM

## 2014-11-25 DIAGNOSIS — M79672 Pain in left foot: Secondary | ICD-10-CM | POA: Diagnosis not present

## 2014-11-25 DIAGNOSIS — N400 Enlarged prostate without lower urinary tract symptoms: Secondary | ICD-10-CM | POA: Diagnosis not present

## 2014-11-25 DIAGNOSIS — E785 Hyperlipidemia, unspecified: Secondary | ICD-10-CM

## 2014-11-25 DIAGNOSIS — Z0000 Encounter for general adult medical examination without abnormal findings: Secondary | ICD-10-CM | POA: Diagnosis not present

## 2014-11-25 DIAGNOSIS — I10 Essential (primary) hypertension: Secondary | ICD-10-CM | POA: Diagnosis not present

## 2014-11-25 DIAGNOSIS — F411 Generalized anxiety disorder: Secondary | ICD-10-CM | POA: Diagnosis not present

## 2014-11-25 DIAGNOSIS — D7589 Other specified diseases of blood and blood-forming organs: Secondary | ICD-10-CM

## 2014-11-25 MED ORDER — LOSARTAN POTASSIUM-HCTZ 50-12.5 MG PO TABS: 1.0000 | ORAL_TABLET | Freq: Every day | ORAL | Status: DC

## 2014-11-25 MED ORDER — FLUTICASONE-SALMETEROL 250-50 MCG/DOSE IN AEPB: 1.0000 | INHALATION_SPRAY | Freq: Two times a day (BID) | RESPIRATORY_TRACT | Status: DC

## 2014-11-25 MED ORDER — BETAMETHASONE DIPROPIONATE 0.05 % EX CREA: TOPICAL_CREAM | Freq: Two times a day (BID) | CUTANEOUS | Status: DC

## 2014-11-25 MED ORDER — COLCHICINE 0.6 MG PO TABS: ORAL_TABLET | ORAL | Status: AC

## 2014-11-25 MED ORDER — METOPROLOL TARTRATE 50 MG PO TABS: 50.0000 mg | ORAL_TABLET | Freq: Every day | ORAL | Status: DC

## 2014-11-25 MED ORDER — OMEPRAZOLE 20 MG PO CPDR: 20.0000 mg | DELAYED_RELEASE_CAPSULE | Freq: Every day | ORAL | Status: DC

## 2014-11-25 MED ORDER — MECLIZINE HCL 25 MG PO TABS: ORAL_TABLET | ORAL | Status: AC

## 2014-11-25 MED ORDER — SIMVASTATIN 40 MG PO TABS: 40.0000 mg | ORAL_TABLET | Freq: Every day | ORAL | Status: DC

## 2014-11-25 MED ORDER — ALPRAZOLAM 0.5 MG PO TABS: 0.5000 mg | ORAL_TABLET | Freq: Two times a day (BID) | ORAL | Status: DC | PRN

## 2014-11-25 NOTE — Progress Notes (Signed)
Subjective:   Matthew Franklin is a 80 y.o. male who presents for an Initial Medicare Annual Wellness Visit.   follow-up of hypertension. Patient has no history of headache. No current chest pain . Due to his COPD he has shortness of breath however that is baseline currently. It does limit his activity level somewhat but he is able to perform ADLs. He has a chronic nonproductive cough. Patient also denies symptoms of TIA such as numbness weakness lateralizing. Patient checks  blood pressure at home and has not had any elevated readings recently. Patient denies side effects from his medication. States taking it regularly. Patient in for follow-up of elevated cholesterol. Doing well without complaints on current medication. Denies side effects of statin including myalgia and arthralgia and nausea. Also in today for liver function testing. Currently no chest pain, shortness of breath or other cardiovascular related symptoms noted.  Patient in for follow-up of GERD. Currently mildly symptomatic only while taking  PPI daily. There is no chest pain but has mild heartburn. No hematemesis and no melena. No dysphagia or choking. Onset is remote. Progression is stable. Complicating factors, none.     Review of Systems  Review of Systems  Constitutional: Positive for malaise/fatigue. Negative for fever, chills, weight loss and diaphoresis.  HENT: Negative for congestion, ear pain, hearing loss, nosebleeds, sore throat and tinnitus.   Eyes: Negative for blurred vision, double vision, photophobia, pain, discharge and redness.  Respiratory: Positive for cough and shortness of breath. Negative for hemoptysis, sputum production and wheezing.   Cardiovascular: Negative for chest pain, palpitations, orthopnea, leg swelling and PND.  Gastrointestinal: Negative for heartburn, nausea, vomiting, abdominal pain, diarrhea, constipation, blood in stool and melena.  Genitourinary: Positive for frequency (with nocturia  3-4 per night). Negative for dysuria, urgency, hematuria and flank pain.  Musculoskeletal: Negative for myalgias, back pain, joint pain, falls and neck pain.  Skin: Negative for itching and rash.  Neurological: Negative for dizziness, tingling, tremors, sensory change, speech change, focal weakness, seizures, loss of consciousness, weakness and headaches.  Endo/Heme/Allergies: Negative for environmental allergies and polydipsia. Does not bruise/bleed easily.  Psychiatric/Behavioral: Negative for depression, suicidal ideas, hallucinations, memory loss and substance abuse. The patient is nervous/anxious. The patient does not have insomnia.        Current Medications (verified) Outpatient Encounter Prescriptions as of 11/25/2014  Medication Sig  . albuterol (PROVENTIL HFA;VENTOLIN HFA) 108 (90 BASE) MCG/ACT inhaler Inhale 2 puffs into the lungs every 4 (four) hours as needed for wheezing or shortness of breath.  Marland Kitchen albuterol (PROVENTIL) (2.5 MG/3ML) 0.083% nebulizer solution Take 3 mLs (2.5 mg total) by nebulization every 6 (six) hours as needed for wheezing or shortness of breath.  . ALPRAZolam (XANAX) 0.5 MG tablet Take 1 tablet (0.5 mg total) by mouth 2 (two) times daily as needed for anxiety.  Marland Kitchen aspirin 81 MG tablet Take 81 mg by mouth daily.  . betamethasone dipropionate (DIPROLENE) 0.05 % cream Apply topically 2 (two) times daily. to affected area  . Calcium Carbonate-Vitamin D (CALCIUM-VITAMIN D) 500-200 MG-UNIT per tablet Take 1 tablet by mouth daily.  . colchicine 0.6 MG tablet Take one po bid prn gout flare  . fluticasone (FLONASE) 50 MCG/ACT nasal spray Place 2 sprays into both nostrils daily.  . Fluticasone-Salmeterol (ADVAIR DISKUS) 250-50 MCG/DOSE AEPB Inhale 1 puff into the lungs 2 (two) times daily.  . folic acid (FOLVITE) 591 MCG tablet Take 400 mcg by mouth daily.  Marland Kitchen loratadine (CLARITIN) 10 MG tablet  Take 10 mg by mouth daily.  Marland Kitchen losartan-hydrochlorothiazide (HYZAAR) 50-12.5 MG  tablet Take 1 tablet by mouth daily.  . meclizine (ANTIVERT) 25 MG tablet TAKE ONE TABLET BY MOUTH THREE TIMES DAILY AS NEEDED FOR DIZZINESS  . metoprolol (LOPRESSOR) 50 MG tablet Take 1 tablet (50 mg total) by mouth daily.  . Multiple Vitamin (MULTIVITAMIN) tablet Take 1 tablet by mouth daily.  Marland Kitchen omeprazole (PRILOSEC) 20 MG capsule Take 1 capsule (20 mg total) by mouth daily.  . simvastatin (ZOCOR) 40 MG tablet Take 1 tablet (40 mg total) by mouth at bedtime.  . [DISCONTINUED] ADVAIR DISKUS 250-50 MCG/DOSE AEPB INHALE ONE PUFF BY MOUTH TWICE DAILY  . [DISCONTINUED] ALPRAZolam (XANAX) 0.5 MG tablet Take 1 tablet (0.5 mg total) by mouth 2 (two) times daily as needed for anxiety.  . [DISCONTINUED] betamethasone dipropionate (DIPROLENE) 0.05 % cream APPLY TWICE DAILY TO AFFECTED AREA  . [DISCONTINUED] colchicine 0.6 MG tablet Take one po bid prn gout flare  . [DISCONTINUED] losartan-hydrochlorothiazide (HYZAAR) 50-12.5 MG tablet Take 1 tablet by mouth daily.  . [DISCONTINUED] meclizine (ANTIVERT) 25 MG tablet TAKE ONE TABLET BY MOUTH THREE TIMES DAILY AS NEEDED FOR DIZZINESS  . [DISCONTINUED] metoprolol (LOPRESSOR) 50 MG tablet Take 1 tablet (50 mg total) by mouth daily.  . [DISCONTINUED] omeprazole (PRILOSEC) 20 MG capsule Take 1 capsule (20 mg total) by mouth daily.  . [DISCONTINUED] simvastatin (ZOCOR) 40 MG tablet Take 1 tablet (40 mg total) by mouth at bedtime.  . sildenafil (VIAGRA) 100 MG tablet Take 0.5-1 tablets (50-100 mg total) by mouth daily as needed for erectile dysfunction. (Patient not taking: Reported on 11/25/2014)  . [DISCONTINUED] calcium-vitamin D (OSCAL) 250-125 MG-UNIT per tablet Take 1 tablet by mouth daily.  . [DISCONTINUED] indomethacin (INDOCIN SR) 75 MG CR capsule Take 1 capsule (75 mg total) by mouth 2 (two) times daily with a meal. (Patient not taking: Reported on 11/25/2014)  . [DISCONTINUED] neomycin-polymyxin-hydrocortisone (CORTISPORIN) otic solution Place 3 drops  into both ears 4 (four) times daily. (Patient not taking: Reported on 11/25/2014)  . [DISCONTINUED] Vitamin D, Ergocalciferol, (DRISDOL) 50000 UNITS CAPS capsule Take 1 capsule (50,000 Units total) by mouth every 7 (seven) days. (Patient not taking: Reported on 11/25/2014)   No facility-administered encounter medications on file as of 11/25/2014.    Allergies (verified) Lipitor   History: Past Medical History  Diagnosis Date  . COPD (chronic obstructive pulmonary disease) ([place])   . Hypertension   . Hyperlipidemia   . Vertigo   . ASCVD (arteriosclerotic cardiovascular disease)    Past Surgical History  Procedure Laterality Date  . Cardiac surgery    . Coronary artery bypass graft     Family History  Problem Relation Age of Onset  . Healthy Mother   . Cancer Father   . Healthy Sister   . Cancer Brother   . Healthy Sister   . Cancer Brother    Social History   Occupational History  . Not on file.   Social History Main Topics  . Smoking status: Former Smoker -- 1.00 packs/day for 20 years    Types: Cigarettes    Quit date: 11/19/1997  . Smokeless tobacco: Not on file  . Alcohol Use: Yes  . Drug Use: No  . Sexual Activity: Not on file    Do you feel safe at home?  Yes  Dietary issues and exercise activities discussed:    Current Dietary habits:  Patient eats what he wants. Has a good appetite. Doesn't eat  a lot of sweets but doesn't avoid them either. He is aware of his history of elevated cholesterol and does try to moderate fat intake.       Objective:    Today's Vitals   11/25/14 0914  BP: 129/73  Pulse: 64  Temp: 97 F (36.1 C)  TempSrc: Oral  Height: '5\' 7"'  (1.702 m)  Weight: 218 lb (98.884 kg)  SpO2: 97%   Body mass index is 34.14 kg/(m^2).   Activities of Daily Living able to perform all ADLs and IADLs however he is short of breath intermittently.   Are there smokers in your home (other than you)? No    Depression Screen PHQ 2/9 Scores  11/25/2014 02/26/2014 11/18/2013 07/01/2013  PHQ - 2 Score 0 0 0 0    Fall Risk Fall Risk  11/25/2014 02/26/2014 11/18/2013 07/01/2013 05/20/2013  Falls in the past year? No No No No No    Cognitive Function: No flowsheet data found.  Immunizations and Health Maintenance Immunization History  Administered Date(s) Administered  . Influenza Split 11/02/2012  . Influenza, High Dose Seasonal PF 11/16/2014  . Influenza,inj,Quad PF,36+ Mos 10/30/2013  . Pneumococcal Conjugate-13 01/16/2013  . Zoster 01/14/2014   Health Maintenance Due  Topic Date Due  . TETANUS/TDAP  08/03/2011  . PNA vac Low Risk Adult (2 of 2 - PPSV23) 01/16/2014    Patient Care Team: Chipper Herb, MD as PCP - General (Family Medicine)  Indicate any recent Medical Services you may have received from other than [facility] providers in the past year (date may be approximate).    Assessment:    Annual Wellness Visit    Screening Tests Health Maintenance  Topic Date Due  . TETANUS/TDAP  08/03/2011  . PNA vac Low Risk Adult (2 of 2 - PPSV23) 01/16/2014  . INFLUENZA VACCINE  08/03/2015  . ZOSTAVAX  Completed        Plan:   During the course of the visit Matthew Franklin was educated and counseled about the following appropriate screening and preventive services:   Vaccines to include Pneumoccal, Influenza, Hepatitis B, Td, Zostavax,  Colorectal cancer screening  Cardiovascular disease screening  Diabetes screening  Glaucoma screening   Nutrition counseling  Prostate cancer screening  Smoking cessation praised. Patient states he has no intention of resuming.   Goals    None     1. Medicare annual wellness visit, initial   2. GAD (generalized anxiety disorder)   3. Heel pain, left   4. Other secondary gout of left foot   5. Essential hypertension   6. Gastroesophageal reflux disease without esophagitis   7. Hyperlipemia   8. Panlobular emphysema ([place])   9. BPH (benign prostatic hyperplasia)   10.  Vitamin D deficiency   11. Coronary artery disease involving coronary bypass graft of native heart with unspecified angina pectoris   12. Screening for malignant neoplasm of the rectum   13. Macrocytosis without anemia     Meds ordered this encounter  Medications  . Fluticasone-Salmeterol (ADVAIR DISKUS) 250-50 MCG/DOSE AEPB    Sig: Inhale 1 puff into the lungs 2 (two) times daily.    Dispense:  60 each    Refill:  11  . ALPRAZolam (XANAX) 0.5 MG tablet    Sig: Take 1 tablet (0.5 mg total) by mouth 2 (two) times daily as needed for anxiety.    Dispense:  30 tablet    Refill:  3  . colchicine 0.6 MG tablet    Sig: Take  one po bid prn gout flare    Dispense:  60 tablet    Refill:  3  . losartan-hydrochlorothiazide (HYZAAR) 50-12.5 MG tablet    Sig: Take 1 tablet by mouth daily.    Dispense:  30 tablet    Refill:  11  . meclizine (ANTIVERT) 25 MG tablet    Sig: TAKE ONE TABLET BY MOUTH THREE TIMES DAILY AS NEEDED FOR DIZZINESS    Dispense:  30 tablet    Refill:  1  . metoprolol (LOPRESSOR) 50 MG tablet    Sig: Take 1 tablet (50 mg total) by mouth daily.    Dispense:  30 tablet    Refill:  11  . omeprazole (PRILOSEC) 20 MG capsule    Sig: Take 1 capsule (20 mg total) by mouth daily.    Dispense:  30 capsule    Refill:  11  . simvastatin (ZOCOR) 40 MG tablet    Sig: Take 1 tablet (40 mg total) by mouth at bedtime.    Dispense:  90 tablet    Refill:  3  . betamethasone dipropionate (DIPROLENE) 0.05 % cream    Sig: Apply topically 2 (two) times daily. to affected area    Dispense:  15 g    Refill:  2  Physical Exam  Constitutional: He is oriented to person, place, and time. He appears well-developed.  HENT:  Head: Normocephalic and atraumatic.  Right Ear: External ear normal.  Left Ear: External ear normal.  Nose: Nose normal.  Mouth/Throat: Oropharynx is clear and moist.  Eyes: Conjunctivae and EOM are normal. Pupils are equal, round, and reactive to light. Right eye  exhibits no discharge. Left eye exhibits no discharge.  Neck: Normal range of motion. Neck supple. No JVD present. No thyromegaly present.  Cardiovascular: Normal rate, regular rhythm and normal heart sounds.  Exam reveals no gallop and no friction rub.   No murmur heard. Pulmonary/Chest: Effort normal and breath sounds normal. No stridor. No respiratory distress. He has no wheezes. He has no rales. He exhibits no tenderness.  Abdominal: Soft. Bowel sounds are normal. He exhibits no distension and no mass. There is no tenderness. There is no rebound and no guarding. No hernia.  Genitourinary: Penis normal.  Prostate firm without  Nodules. Enlarged  Lymphadenopathy:    He has no cervical adenopathy.  Neurological: He is alert and oriented to person, place, and time. He displays normal reflexes. No cranial nerve deficit. He exhibits normal muscle tone. Coordination normal.  Skin: Skin is warm and dry. No rash noted. No erythema.  Psychiatric: He has a normal mood and affect. His behavior is normal. Judgment and thought content normal.  Vitals reviewed.    Orders Placed This Encounter  Procedures  . Fecal occult blood, imunochemical  . DG Chest 2 View    Standing Status: Future     Number of Occurrences: 1     Standing Expiration Date: 01/25/2016    Order Specific Question:  Reason for Exam (SYMPTOM  OR DIAGNOSIS REQUIRED)    Answer:  COPD, DOE    Order Specific Question:  Preferred imaging location?    Answer:  Internal  . CBC with Differential/Platelet  . CMP14+EGFR  . NMR, lipoprofile  . VITAMIN D 25 Hydroxy (Vit-D Deficiency, Fractures)  . Uric acid  . PSA, total and free  . B12 and Folate Panel  . Specimen status report  . EKG 12-Lead  . PR BREATHING CAPACITY TEST    EKG showed no  acute ischemic change.  Pulmonary function showed obstruction consistent with COPD.  Chest x-ray confirmed COPD  Vaccine is up-to-date patient will be due for pneumococcal vaccine and TDaP. He  is aware and will return for those.  Patient Instructions (the written plan) were given to the patient.   Claretta Fraise, MD   11/30/2014

## 2014-11-26 LAB — NMR, LIPOPROFILE
Cholesterol: 168 mg/dL (ref 100–199)
HDL Cholesterol by NMR: 47 mg/dL (ref >39)
HDL Particle Number: 33.3 umol/L (ref >=30.5)
LDL Particle Number: 1384 nmol/L — ABNORMAL HIGH (ref <1000)
LDL Size: 20.1 nm (ref >20.5)
LDL-C: 87 mg/dL (ref 0–99)
LP-IR Score: 77 — ABNORMAL HIGH (ref <=45)
Small LDL Particle Number: 880 nmol/L — ABNORMAL HIGH (ref <=527)
Triglycerides by NMR: 172 mg/dL — ABNORMAL HIGH (ref 0–149)

## 2014-11-26 LAB — CMP14+EGFR
ALT: 19 IU/L (ref 0–44)
AST: 14 IU/L (ref 0–40)
Albumin/Globulin Ratio: 1.8 (ref 1.1–2.5)
Albumin: 4.4 g/dL (ref 3.5–4.8)
Alkaline Phosphatase: 48 IU/L (ref 39–117)
BUN/Creatinine Ratio: 15 (ref 10–22)
BUN: 14 mg/dL (ref 8–27)
Bilirubin Total: 0.5 mg/dL (ref 0.0–1.2)
CO2: 26 mmol/L (ref 18–29)
Calcium: 9.5 mg/dL (ref 8.6–10.2)
Chloride: 98 mmol/L (ref 97–106)
Creatinine, Ser: 0.96 mg/dL (ref 0.76–1.27)
GFR calc Af Amer: 87 mL/min/{1.73_m2} (ref >59)
GFR calc non Af Amer: 75 mL/min/{1.73_m2} (ref >59)
Globulin, Total: 2.4 g/dL (ref 1.5–4.5)
Glucose: 99 mg/dL (ref 65–99)
Potassium: 5.1 mmol/L (ref 3.5–5.2)
Sodium: 139 mmol/L (ref 136–144)
Total Protein: 6.8 g/dL (ref 6.0–8.5)

## 2014-11-26 LAB — PSA, TOTAL AND FREE
PSA, Free Pct: 34.3 %
PSA, Free: 0.24 ng/mL
Prostate Specific Ag, Serum: 0.7 ng/mL (ref 0.0–4.0)

## 2014-11-26 LAB — CBC WITH DIFFERENTIAL/PLATELET
Basophils Absolute: 0 10*3/uL (ref 0.0–0.2)
Basos: 0 %
EOS (ABSOLUTE): 0.5 10*3/uL — ABNORMAL HIGH (ref 0.0–0.4)
Eos: 7 %
Hematocrit: 45 % (ref 37.5–51.0)
Hemoglobin: 15 g/dL (ref 12.6–17.7)
Immature Grans (Abs): 0 10*3/uL (ref 0.0–0.1)
Immature Granulocytes: 0 %
Lymphocytes Absolute: 2.1 10*3/uL (ref 0.7–3.1)
Lymphs: 30 %
MCH: 33.9 pg — ABNORMAL HIGH (ref 26.6–33.0)
MCHC: 33.3 g/dL (ref 31.5–35.7)
MCV: 102 fL — ABNORMAL HIGH (ref 79–97)
Monocytes Absolute: 0.7 10*3/uL (ref 0.1–0.9)
Monocytes: 9 %
Neutrophils Absolute: 3.8 10*3/uL (ref 1.4–7.0)
Neutrophils: 54 %
Platelets: 299 10*3/uL (ref 150–379)
RBC: 4.43 x10E6/uL (ref 4.14–5.80)
RDW: 11.9 % — ABNORMAL LOW (ref 12.3–15.4)
WBC: 7.2 10*3/uL (ref 3.4–10.8)

## 2014-11-26 LAB — VITAMIN D 25 HYDROXY (VIT D DEFICIENCY, FRACTURES): Vit D, 25-Hydroxy: 25.1 ng/mL — ABNORMAL LOW (ref 30.0–100.0)

## 2014-11-26 LAB — URIC ACID: Uric Acid: 6.7 mg/dL (ref 3.7–8.6)

## 2014-11-27 ENCOUNTER — Other Ambulatory Visit: Payer: Self-pay | Admitting: Family Medicine

## 2014-11-28 LAB — SPECIMEN STATUS REPORT

## 2014-11-28 LAB — B12 AND FOLATE PANEL
Folate: >20 ng/mL (ref >3.0)
Vitamin B-12: 607 pg/mL (ref 211–946)

## 2014-11-29 ENCOUNTER — Other Ambulatory Visit: Payer: Self-pay | Admitting: Family Medicine

## 2014-11-29 MED ORDER — VITAMIN D (ERGOCALCIFEROL) 1.25 MG (50000 UNIT) PO CAPS: 50000.0000 [IU] | ORAL_CAPSULE | ORAL | Status: DC

## 2014-11-30 ENCOUNTER — Telehealth: Payer: Self-pay | Admitting: *Deleted

## 2014-11-30 ENCOUNTER — Encounter: Payer: Self-pay | Admitting: Family Medicine

## 2014-11-30 DIAGNOSIS — M10472 Other secondary gout, left ankle and foot: Secondary | ICD-10-CM | POA: Insufficient documentation

## 2014-11-30 DIAGNOSIS — F411 Generalized anxiety disorder: Secondary | ICD-10-CM | POA: Insufficient documentation

## 2014-11-30 LAB — FECAL OCCULT BLOOD, IMMUNOCHEMICAL: Fecal Occult Bld: NEGATIVE

## 2014-11-30 MED ORDER — TAMSULOSIN HCL 0.4 MG PO CAPS: 0.8000 mg | ORAL_CAPSULE | Freq: Every day | ORAL | Status: DC

## 2014-11-30 NOTE — Telephone Encounter (Signed)
Pt aware rx sent.  

## 2014-11-30 NOTE — Telephone Encounter (Signed)
Tamsulosin sent

## 2014-11-30 NOTE — Telephone Encounter (Signed)
Patient states that he was suppose to have gotten a rx for frequent urination. Please advise.

## 2014-12-03 ENCOUNTER — Encounter: Payer: Self-pay | Admitting: Family Medicine

## 2014-12-18 ENCOUNTER — Other Ambulatory Visit: Payer: Self-pay | Admitting: Family Medicine

## 2014-12-31 DIAGNOSIS — G4733 Obstructive sleep apnea (adult) (pediatric): Secondary | ICD-10-CM | POA: Diagnosis not present

## 2015-03-01 ENCOUNTER — Telehealth: Payer: Self-pay | Admitting: Emergency Medicine

## 2015-03-01 MED ORDER — FLUTICASONE PROPIONATE 50 MCG/ACT NA SUSP: 2.0000 | Freq: Every day | NASAL | Status: DC

## 2015-03-01 NOTE — Telephone Encounter (Signed)
Spoke with pt's s/o [employer], states pt needs refill on flonase to [facility] in [place].  This has been sent.  Nothing further needed .

## 2015-04-01 DIAGNOSIS — G4733 Obstructive sleep apnea (adult) (pediatric): Secondary | ICD-10-CM | POA: Diagnosis not present

## 2015-05-15 ENCOUNTER — Other Ambulatory Visit: Payer: Self-pay | Admitting: Family Medicine

## 2015-05-17 ENCOUNTER — Encounter: Payer: Self-pay | Admitting: Emergency Medicine

## 2015-05-17 ENCOUNTER — Ambulatory Visit (INDEPENDENT_AMBULATORY_CARE_PROVIDER_SITE_OTHER): Payer: Commercial Managed Care - HMO | Admitting: Emergency Medicine

## 2015-05-17 VITALS — BP 120/80 | HR 54 | Ht 67.0 in | Wt 218.0 lb

## 2015-05-17 DIAGNOSIS — G4733 Obstructive sleep apnea (adult) (pediatric): Secondary | ICD-10-CM | POA: Diagnosis not present

## 2015-05-17 DIAGNOSIS — J431 Panlobular emphysema: Secondary | ICD-10-CM

## 2015-05-17 DIAGNOSIS — F411 Generalized anxiety disorder: Secondary | ICD-10-CM

## 2015-05-17 NOTE — Assessment & Plan Note (Signed)
Good compliance with clear benefit from his CPAP. He has a lot of daytime energy and denies daytime sleepiness.

## 2015-05-17 NOTE — Patient Instructions (Signed)
Continue your CPAP every night.  Continue your Advair twice a day Keep your albuterol available to use if needed for shortness of breath.  Continue your flonase and loratadine.  Follow with Dr Lamonte Sakai in 12 months or sooner if you have any problems

## 2015-05-17 NOTE — Progress Notes (Signed)
Subjective:    Patient ID: Matthew Franklin, male    DOB: [birth year], 81 y.o.   MRN: [id number]  HPI 81 yo former smoker, hx HTN, hyperlipidemia. Dx COPD in 2012 when he had an AE-COPD. He was hospitalized again early 1/'15 for apparent AE-COPD, was noted to have nocturnal hypoxemia. He is currently on Advair bid and Tudorza bid + albuterol nebs > uses it about 2x a day. He received prevnar 13 at Dr Gaston Islam office 01/16/13. His wife understands the meds better than he does.   At baseline his breathing is good. Exertional tolerance > able to walk a mile with his wife. He hears wheeze espec at night. Some cough but not significant. He has consistent PND, also has GERD. He is on loratadine and omeprazole.   ROV 03/12/13 -- follows for COPD and suspected OSA. He had PSG that showed an AHI of 13.9 on 02/26/13. He did not get [place] last month because of the cost increase. He remains on Advair.   ROV 06/13/13 -- follow up for COPD and OSA. He is now on CPAP and is tolerating. No longer dozes or naps. He never started [place], is tolerating Advair. No SABA use, very rare.   ROV 01/06/14 -- follow up for his COPD - he tells me that he has been doing very well. He is on Advair and tolerates well. Rarely uses albuterol. He is about to start doing a workout program. No flares since last time. He is up to date on flu shot. He also had Prevnar 13 in Fall '15. He is on CPAP with good compliance - lots of energy, does not nap.   ROV 11/19/14 -- patient follows up today for his COPD and his obstructive sleep apnea. He has been compliant with his CPAP, feels well rested. He has plenty of energy now. He is on Advair bid, uses albuterol prn, once this year. Never needs his albuterol HFA. Occasional wheeze, no cough.  He has nasal congestion, on loratadine.   ROV 05/17/15 -- patient has history of COPD and objective sleep apnea on CPAP. Also with allergic rhinitis and cough. Has been on loratadine and flonase. Has been  working in the tobacco fields, has been also going to the gym regularly. He has been on Advair, albuterol prn. Her has been wearing CPAP reliably, every night. Feels that he is benefiting from it, has more energy, less sleepiness. Gets tubing and supplies reliably. Denies any cough, no wheeze.     CAT Score 11/19/2014  Total CAT Score 3     Review of Systems  Constitutional: Negative for fever and unexpected weight change.  HENT: Positive for congestion and postnasal drip. Negative for dental problem, ear pain, nosebleeds, rhinorrhea, sinus pressure, sneezing, sore throat and trouble swallowing.   Eyes: Negative for redness and itching.  Respiratory: Positive for wheezing. Negative for cough, chest tightness and shortness of breath.   Cardiovascular: Negative for palpitations and leg swelling.  Gastrointestinal: Negative for nausea and vomiting.  Genitourinary: Negative for dysuria.  Musculoskeletal: Negative for joint swelling.  Skin: Negative for rash.  Neurological: Negative for headaches.  Hematological: Does not bruise/bleed easily.  Psychiatric/Behavioral: Negative for dysphoric mood. The patient is not nervous/anxious.         Objective:   Physical Exam Filed Vitals:   05/17/15 0959  BP: 120/80  Pulse: 54  Height: 5\' 7"  (1.702 m)  Weight: 218 lb (98.884 kg)  SpO2: 96%   Gen: Pleasant, obese, in no  distress,  normal affect  ENT: No lesions,  mouth clear,  oropharynx clear, no postnasal drip, significantly decreased hearing  Neck: No JVD, no TMG, no carotid bruits  Lungs: No use of accessory muscles, distant, clear without rales or rhonchi  Cardiovascular: RRR, heart sounds normal, no murmur or gallops, no peripheral edema  Musculoskeletal: No deformities, no cyanosis or clubbing  Neuro: alert, non focal  Skin: Warm, no lesions or rashes       Assessment & Plan:  COPD (chronic obstructive pulmonary disease) Good exercise tolerance, no daily symptoms, no  cough no wheezing. I will keep him on Advair twice a day and albuterol as needed. No recent exacerbations  OSA (obstructive sleep apnea) Good compliance with clear benefit from his CPAP. He has a lot of daytime energy and denies daytime sleepiness.

## 2015-05-17 NOTE — Addendum Note (Signed)
Addended by: Desmond Dike C on: 05/17/2015 11:06 AM   Modules accepted: SmartSet

## 2015-05-17 NOTE — Assessment & Plan Note (Signed)
Good exercise tolerance, no daily symptoms, no cough no wheezing. I will keep him on Advair twice a day and albuterol as needed. No recent exacerbations

## 2015-05-25 ENCOUNTER — Telehealth: Payer: Self-pay | Admitting: Emergency Medicine

## 2015-05-25 ENCOUNTER — Other Ambulatory Visit: Payer: Self-pay | Admitting: Family Medicine

## 2015-05-25 DIAGNOSIS — F411 Generalized anxiety disorder: Secondary | ICD-10-CM

## 2015-05-25 MED ORDER — ALPRAZOLAM 0.5 MG PO TABS: 0.5000 mg | ORAL_TABLET | Freq: Two times a day (BID) | ORAL | Status: DC | PRN

## 2015-05-25 NOTE — Telephone Encounter (Signed)
Spoke with pt and he is requesting refill on Xanax. Verified with Ria Comment that [facility] had stated he would fill this for the pt. Rx called in. Pt aware. Nothing further needed.

## 2015-06-21 ENCOUNTER — Other Ambulatory Visit: Payer: Self-pay | Admitting: Family Medicine

## 2015-07-02 DIAGNOSIS — G4733 Obstructive sleep apnea (adult) (pediatric): Secondary | ICD-10-CM | POA: Diagnosis not present

## 2015-07-27 ENCOUNTER — Other Ambulatory Visit: Payer: Self-pay | Admitting: *Deleted

## 2015-07-27 MED ORDER — FLUTICASONE PROPIONATE 50 MCG/ACT NA SUSP: 2.0000 | Freq: Every day | NASAL | 5 refills | Status: DC

## 2015-08-03 ENCOUNTER — Other Ambulatory Visit: Payer: Self-pay | Admitting: *Deleted

## 2015-08-03 MED ORDER — FLUTICASONE PROPIONATE 50 MCG/ACT NA SUSP: 2.0000 | Freq: Every day | NASAL | 5 refills | Status: DC

## 2015-08-09 ENCOUNTER — Other Ambulatory Visit: Payer: Self-pay | Admitting: Family Medicine

## 2015-08-09 DIAGNOSIS — F411 Generalized anxiety disorder: Secondary | ICD-10-CM

## 2015-09-03 ENCOUNTER — Other Ambulatory Visit: Payer: Self-pay | Admitting: Family Medicine

## 2015-09-03 DIAGNOSIS — F411 Generalized anxiety disorder: Secondary | ICD-10-CM

## 2015-09-08 ENCOUNTER — Other Ambulatory Visit: Payer: Self-pay | Admitting: Family Medicine

## 2015-09-08 DIAGNOSIS — F411 Generalized anxiety disorder: Secondary | ICD-10-CM

## 2015-09-09 ENCOUNTER — Other Ambulatory Visit: Payer: Self-pay | Admitting: Family Medicine

## 2015-09-09 DIAGNOSIS — F411 Generalized anxiety disorder: Secondary | ICD-10-CM

## 2015-09-13 ENCOUNTER — Other Ambulatory Visit: Payer: Self-pay

## 2015-09-13 DIAGNOSIS — F411 Generalized anxiety disorder: Secondary | ICD-10-CM

## 2015-10-05 DIAGNOSIS — G4733 Obstructive sleep apnea (adult) (pediatric): Secondary | ICD-10-CM | POA: Diagnosis not present

## 2015-10-14 ENCOUNTER — Ambulatory Visit (INDEPENDENT_AMBULATORY_CARE_PROVIDER_SITE_OTHER): Payer: Commercial Managed Care - HMO

## 2015-10-14 DIAGNOSIS — Z23 Encounter for immunization: Secondary | ICD-10-CM | POA: Diagnosis not present

## 2015-10-20 ENCOUNTER — Other Ambulatory Visit: Payer: Self-pay | Admitting: *Deleted

## 2015-10-20 DIAGNOSIS — F411 Generalized anxiety disorder: Secondary | ICD-10-CM

## 2015-11-01 ENCOUNTER — Other Ambulatory Visit: Payer: Self-pay | Admitting: Family Medicine

## 2015-11-02 NOTE — Telephone Encounter (Signed)
Authorize 30 days only. Then contact the patient letting them know that they will need an appointment before any further prescriptions can be sent in. 

## 2015-11-05 ENCOUNTER — Ambulatory Visit (INDEPENDENT_AMBULATORY_CARE_PROVIDER_SITE_OTHER): Payer: Commercial Managed Care - HMO | Admitting: Family Medicine

## 2015-11-05 ENCOUNTER — Encounter: Payer: Self-pay | Admitting: Family Medicine

## 2015-11-05 VITALS — BP 131/71 | HR 61 | Temp 97.5°F | Ht 67.0 in | Wt 213.2 lb

## 2015-11-05 DIAGNOSIS — E785 Hyperlipidemia, unspecified: Secondary | ICD-10-CM | POA: Diagnosis not present

## 2015-11-05 DIAGNOSIS — Z0000 Encounter for general adult medical examination without abnormal findings: Secondary | ICD-10-CM

## 2015-11-05 DIAGNOSIS — Z23 Encounter for immunization: Secondary | ICD-10-CM | POA: Diagnosis not present

## 2015-11-05 DIAGNOSIS — L570 Actinic keratosis: Secondary | ICD-10-CM

## 2015-11-05 DIAGNOSIS — I10 Essential (primary) hypertension: Secondary | ICD-10-CM

## 2015-11-05 DIAGNOSIS — L989 Disorder of the skin and subcutaneous tissue, unspecified: Secondary | ICD-10-CM

## 2015-11-05 DIAGNOSIS — L821 Other seborrheic keratosis: Secondary | ICD-10-CM

## 2015-11-05 MED ORDER — SIMVASTATIN 40 MG PO TABS: 40.0000 mg | ORAL_TABLET | Freq: Every day | ORAL | 3 refills | Status: DC

## 2015-11-05 MED ORDER — LOSARTAN POTASSIUM-HCTZ 50-12.5 MG PO TABS: 1.0000 | ORAL_TABLET | Freq: Every day | ORAL | 3 refills | Status: DC

## 2015-11-05 MED ORDER — BETAMETHASONE DIPROPIONATE 0.05 % EX CREA: TOPICAL_CREAM | Freq: Two times a day (BID) | CUTANEOUS | 2 refills | Status: AC

## 2015-11-05 MED ORDER — METOPROLOL TARTRATE 50 MG PO TABS: 50.0000 mg | ORAL_TABLET | Freq: Every day | ORAL | 3 refills | Status: DC

## 2015-11-05 MED ORDER — PNEUMOCOCCAL VAC POLYVALENT 25 MCG/0.5ML IJ INJ: 0.5000 mL | INJECTION | INTRAMUSCULAR | Status: DC

## 2015-11-05 MED ORDER — TRAZODONE HCL 50 MG PO TABS: 50.0000 mg | ORAL_TABLET | Freq: Every evening | ORAL | 3 refills | Status: DC | PRN

## 2015-11-05 MED ORDER — FLUTICASONE-SALMETEROL 250-50 MCG/DOSE IN AEPB: INHALATION_SPRAY | RESPIRATORY_TRACT | 11 refills | Status: DC

## 2015-11-05 NOTE — Addendum Note (Signed)
Addended by: Nigel Berthold C on: 11/05/2015 01:30 PM   Modules accepted: Orders

## 2015-11-05 NOTE — Addendum Note (Signed)
Addended by: Liliane Bade on: 11/05/2015 12:08 PM   Modules accepted: Orders

## 2015-11-05 NOTE — Patient Instructions (Addendum)
Great to meet you!  Come back in 6 months for routine follow up unless you need [place] sooner.     Try trazodone as needed for sleep

## 2015-11-05 NOTE — Progress Notes (Signed)
HPI  Patient presents today here for an annual physical and follow-up for chronic medical conditions.  Patient is occupationally active as a farmer, however no formal exercise routine. He watches his diet moderately.  No chest pains, dyspnea, palpitations, leg edema. He is very good medication compliance.  His blood pressure when checked at home is usually 660Y or 30Z systolic over 60F diastolic  She has to DG red spots on his back that been there for several months. They would like these addressed. He also has a brown spot on his left temple.  He uses Xanax about 1-3 times weekly to help him sleep. He states that he gets a little bit nervous before bed sometimes it helps quite a bit. He states that he can easily go without it and is open to trying a new medication. He has not had any in 2 weeks. He gets great benefit from his CPAP   PMH: Smoking status noted ROS: Per HPI  Objective: BP 131/71   Pulse 61   Temp 97.5 F (36.4 C) (Oral)   Ht _0  (1.702 m)   Wt 213 lb 3.2 oz (96.7 kg)   BMI 33.39 kg/m  Gen: NAD, alert, cooperative with exam HEENT: NCAT CV: RRR, good S1/S2, no murmur Resp: CTABL, no wheezes, non-labored Abd: SNTND, BS present, no guarding or organomegaly Ext: No edema, warm Neuro: Alert and oriented, No gross deficits  Skin: Bilateral forearms with several hyperkeratotic small papules Left temple with approximately 1 cm in diameter brown raised on appearing lesion  Back-  to lesions that are both erythematous, approximately 5-9 mm in diameter irregular shaped and slightly scaled   Assessment and plan:  # Annual physical exam Normal exam Discussed healthy lifestyle choices and staying active Basic labs today   # Hypertension Well-controlled on current medications Continue losartan/HCTZ If gout becomes more of a persistent problem consider discontinuing HCTZ, however losartan does have a uricosuric effect which is very beneficial.  #  Hyperlipidemia Repeating labs today Stable  # Skin lesions, seborrheic keratosis, actinic keratosis Several actinic keratoses on bilateral forearms Left temple consistent with seborrheic keratosis Lesions on the back are consistent with possible squamous cell carcinoma with itching and persistence for several months. Recommended dermatology referral for evaluation of erythematous lesions, also offered biopsy if They do not prefer referral, however they would be happy to see a dermatologist.  Difficulty sleeping Patient was taking Xanax for difficulty sleeping, GAD is on his problem list, however does not appear to be a severe problem for him. Trial of trazodone Could also consider 0.25 mg Xanax if needed, however given age and COPD I think it's appropriate to try a trial off.  Pneumovax given today-counseling provided for all vaccine components   Orders Placed This Encounter  Procedures  . CMP14+EGFR  . CBC with Differential  . Lipid panel    Standing Status:   Future    Standing Expiration Date:   11/04/2016  . PSA  . Ambulatory referral to Dermatology    Referral Priority:   Routine    Referral Type:   Consultation    Referral Reason:   Specialty Services Required    Requested Specialty:   Dermatology    Number of Visits Requested:   1    Meds ordered this encounter  Medications  . betamethasone dipropionate (DIPROLENE) 0.05 % cream    Sig: Apply topically 2 (two) times daily. to affected area    Dispense:  15 g  Refill:  2  . Fluticasone-Salmeterol (ADVAIR DISKUS) 250-50 MCG/DOSE AEPB    Sig: Inhale 1 puff by mouth 2 times a day    Dispense:  60 each    Refill:  11  . losartan-hydrochlorothiazide (HYZAAR) 50-12.5 MG tablet    Sig: Take 1 tablet by mouth daily.    Dispense:  90 tablet    Refill:  3  . metoprolol (LOPRESSOR) 50 MG tablet    Sig: Take 1 tablet (50 mg total) by mouth daily.    Dispense:  90 tablet    Refill:  3  . simvastatin (ZOCOR) 40 MG tablet      Sig: Take 1 tablet (40 mg total) by mouth at bedtime.    Dispense:  90 tablet    Refill:  3  . traZODone (DESYREL) 50 MG tablet    Sig: Take 1-2 tablets (50-100 mg total) by mouth at bedtime as needed for sleep.    Dispense:  60 tablet    Refill:  [place], MD [facility] 11/05/2015, 11:51 AM

## 2015-11-06 LAB — CMP14+EGFR
ALT: 17 IU/L (ref 0–44)
AST: 8 IU/L (ref 0–40)
Albumin/Globulin Ratio: 1.7 (ref 1.2–2.2)
Albumin: 4.5 g/dL (ref 3.5–4.8)
Alkaline Phosphatase: 57 IU/L (ref 39–117)
BUN/Creatinine Ratio: 18 (ref 10–24)
BUN: 16 mg/dL (ref 8–27)
Bilirubin Total: 0.4 mg/dL (ref 0.0–1.2)
CO2: 28 mmol/L (ref 18–29)
Calcium: 9.5 mg/dL (ref 8.6–10.2)
Chloride: 97 mmol/L (ref 96–106)
Creatinine, Ser: 0.89 mg/dL (ref 0.76–1.27)
GFR calc Af Amer: 94 mL/min/{1.73_m2} (ref >59)
GFR calc non Af Amer: 81 mL/min/{1.73_m2} (ref >59)
Globulin, Total: 2.6 g/dL (ref 1.5–4.5)
Glucose: 91 mg/dL (ref 65–99)
Potassium: 4.4 mmol/L (ref 3.5–5.2)
Sodium: 139 mmol/L (ref 134–144)
Total Protein: 7.1 g/dL (ref 6.0–8.5)

## 2015-11-06 LAB — LIPID PANEL
Chol/HDL Ratio: 3.7 ratio units (ref 0.0–5.0)
Cholesterol, Total: 162 mg/dL (ref 100–199)
HDL: 44 mg/dL (ref >39)
LDL Calculated: 81 mg/dL (ref 0–99)
Triglycerides: 183 mg/dL — ABNORMAL HIGH (ref 0–149)
VLDL Cholesterol Cal: 37 mg/dL (ref 5–40)

## 2015-11-06 LAB — CBC WITH DIFFERENTIAL/PLATELET
Basophils Absolute: 0 10*3/uL (ref 0.0–0.2)
Basos: 0 %
EOS (ABSOLUTE): 0.5 10*3/uL — ABNORMAL HIGH (ref 0.0–0.4)
Eos: 6 %
Hematocrit: 44.6 % (ref 37.5–51.0)
Hemoglobin: 15.3 g/dL (ref 12.6–17.7)
Immature Grans (Abs): 0 10*3/uL (ref 0.0–0.1)
Immature Granulocytes: 0 %
Lymphocytes Absolute: 2.1 10*3/uL (ref 0.7–3.1)
Lymphs: 28 %
MCH: 34.4 pg — ABNORMAL HIGH (ref 26.6–33.0)
MCHC: 34.3 g/dL (ref 31.5–35.7)
MCV: 100 fL — ABNORMAL HIGH (ref 79–97)
Monocytes Absolute: 0.8 10*3/uL (ref 0.1–0.9)
Monocytes: 11 %
Neutrophils Absolute: 4.1 10*3/uL (ref 1.4–7.0)
Neutrophils: 55 %
Platelets: 290 10*3/uL (ref 150–379)
RBC: 4.45 x10E6/uL (ref 4.14–5.80)
RDW: 12.9 % (ref 12.3–15.4)
WBC: 7.6 10*3/uL (ref 3.4–10.8)

## 2015-11-06 LAB — PSA: Prostate Specific Ag, Serum: 0.7 ng/mL (ref 0.0–4.0)

## 2015-11-18 ENCOUNTER — Other Ambulatory Visit: Payer: Self-pay | Admitting: *Deleted

## 2015-11-18 DIAGNOSIS — K219 Gastro-esophageal reflux disease without esophagitis: Secondary | ICD-10-CM

## 2015-11-18 MED ORDER — OMEPRAZOLE 20 MG PO CPDR: 20.0000 mg | DELAYED_RELEASE_CAPSULE | Freq: Every day | ORAL | 5 refills | Status: DC

## 2015-12-22 DIAGNOSIS — D045 Carcinoma in situ of skin of trunk: Secondary | ICD-10-CM | POA: Diagnosis not present

## 2015-12-22 DIAGNOSIS — D485 Neoplasm of uncertain behavior of skin: Secondary | ICD-10-CM | POA: Diagnosis not present

## 2015-12-22 DIAGNOSIS — L821 Other seborrheic keratosis: Secondary | ICD-10-CM | POA: Diagnosis not present

## 2015-12-22 DIAGNOSIS — L309 Dermatitis, unspecified: Secondary | ICD-10-CM | POA: Diagnosis not present

## 2015-12-22 DIAGNOSIS — L570 Actinic keratosis: Secondary | ICD-10-CM | POA: Diagnosis not present

## 2015-12-22 DIAGNOSIS — L814 Other melanin hyperpigmentation: Secondary | ICD-10-CM | POA: Diagnosis not present

## 2016-01-06 DIAGNOSIS — C44529 Squamous cell carcinoma of skin of other part of trunk: Secondary | ICD-10-CM | POA: Diagnosis not present

## 2016-01-10 DIAGNOSIS — G4733 Obstructive sleep apnea (adult) (pediatric): Secondary | ICD-10-CM | POA: Diagnosis not present

## 2016-01-26 ENCOUNTER — Other Ambulatory Visit: Payer: Self-pay | Admitting: Family Medicine

## 2016-01-27 NOTE — Telephone Encounter (Signed)
Pt last seen 11/04/2016 Please advise

## 2016-04-10 DIAGNOSIS — G4733 Obstructive sleep apnea (adult) (pediatric): Secondary | ICD-10-CM | POA: Diagnosis not present

## 2016-05-01 ENCOUNTER — Other Ambulatory Visit: Payer: Self-pay | Admitting: Family Medicine

## 2016-05-01 DIAGNOSIS — K219 Gastro-esophageal reflux disease without esophagitis: Secondary | ICD-10-CM

## 2016-05-14 ENCOUNTER — Other Ambulatory Visit: Payer: Self-pay | Admitting: Family Medicine

## 2016-05-16 ENCOUNTER — Ambulatory Visit (INDEPENDENT_AMBULATORY_CARE_PROVIDER_SITE_OTHER): Payer: Medicare HMO | Admitting: Emergency Medicine

## 2016-05-16 ENCOUNTER — Encounter: Payer: Self-pay | Admitting: Emergency Medicine

## 2016-05-16 DIAGNOSIS — J431 Panlobular emphysema: Secondary | ICD-10-CM

## 2016-05-16 DIAGNOSIS — G4733 Obstructive sleep apnea (adult) (pediatric): Secondary | ICD-10-CM

## 2016-05-16 DIAGNOSIS — J301 Allergic rhinitis due to pollen: Secondary | ICD-10-CM

## 2016-05-16 DIAGNOSIS — J309 Allergic rhinitis, unspecified: Secondary | ICD-10-CM | POA: Insufficient documentation

## 2016-05-16 NOTE — Patient Instructions (Signed)
Please continue Advair twice a day Use albuterol nebulizer as needed for shortness of breath Please continue CPAP every night Your pneumonia shots are up-to-date Get the flu shot in the fall Continue loratadine 10 mg daily Use Flonase nasal spray, 2 sprays each nostril only as needed for congestion Follow with Dr Lamonte Sakai in 12 months or sooner if you have any problems

## 2016-05-16 NOTE — Assessment & Plan Note (Signed)
Doing well on only Advair twice a day. He rarely needs albuterol. No exacerbations. We will continue the same regimen.

## 2016-05-16 NOTE — Assessment & Plan Note (Signed)
Good compliance with CPAP. He clearly benefits both with better sleep and increased daytime energy. We will continue same regimen. He gets his equipment through [employer]

## 2016-05-16 NOTE — Assessment & Plan Note (Signed)
Doing well on loratadine 10 mg daily. He has changed his fluticasone nasal spray to when necessary.

## 2016-05-16 NOTE — Progress Notes (Signed)
Subjective:    Patient ID: Matthew Franklin, male    DOB: [birth year], 82 y.o.   MRN: [id number]  HPI 82 yo former smoker, hx HTN, hyperlipidemia. Dx COPD in 2012 when he had an AE-COPD.  He received prevnar 13 at Dr Gaston Islam office 01/16/13.  At baseline his breathing is good. Exertional tolerance > able to walk a mile with his wife. He hears wheeze espec at night. Some cough but not significant. He has consistent PND, also has GERD. He is on loratadine and omeprazole.    ROV 05/17/15 -- patient has history of COPD and objective sleep apnea on CPAP. Also with allergic rhinitis and cough. Has been on loratadine and flonase. Has been working in the tobacco fields, has been also going to the gym regularly. He has been on Advair, albuterol prn. Her has been wearing CPAP reliably, every night. Feels that he is benefiting from it, has more energy, less sleepiness. Gets tubing and supplies reliably. Denies any cough, no wheeze.   ROV 05/16/16 -- History of COPD and obstructive sleep apnea. Also allergic rhinitis with some associated cough. This is an annual follow-up visit. He reports that he is on Advair twice a day, tolerates well. He has albuterol nebs available to use prn. He has not had any flares since last time. He is on loratadine, stopped flonase due to nose bleeding. Allergies well controlled. PNA shots up to date. He wears CPAP reliably - benefits significantly. It is in good repair - [employer] is his company, it is about 5 yrs olkd.     No flowsheet data found.   Review of Systems  Constitutional: Negative for fever and unexpected weight change.  HENT: Positive for congestion and postnasal drip. Negative for dental problem, ear pain, nosebleeds, rhinorrhea, sinus pressure, sneezing, sore throat and trouble swallowing.   Eyes: Negative for redness and itching.  Respiratory: Positive for wheezing. Negative for cough, chest tightness and shortness of breath.   Cardiovascular: Negative for  palpitations and leg swelling.  Gastrointestinal: Negative for nausea and vomiting.  Genitourinary: Negative for dysuria.  Musculoskeletal: Negative for joint swelling.  Skin: Negative for rash.  Neurological: Negative for headaches.  Hematological: Does not bruise/bleed easily.  Psychiatric/Behavioral: Negative for dysphoric mood. The patient is not nervous/anxious.         Objective:   Physical Exam Vitals:   05/16/16 0924  BP: 118/60  Pulse: 64  SpO2: 96%  Weight: 207 lb (93.9 kg)  Height: 5\' 7"  (1.702 m)   Gen: Pleasant, obese, in no distress,  normal affect  ENT: No lesions,  mouth clear,  oropharynx clear, no postnasal drip, decreased hearing  Neck: No JVD, no TMG, no carotid bruits  Lungs: No use of accessory muscles, distant, clear without rales or rhonchi  Cardiovascular: RRR, heart sounds normal, no murmur or gallops, no peripheral edema  Musculoskeletal: No deformities, no cyanosis or clubbing  Neuro: alert, non focal  Skin: Warm, no lesions or rashes       Assessment & Plan:  COPD (chronic obstructive pulmonary disease) Doing well on only Advair twice a day. He rarely needs albuterol. No exacerbations. We will continue the same regimen.  OSA (obstructive sleep apnea) Good compliance with CPAP. He clearly benefits both with better sleep and increased daytime energy. We will continue same regimen. He gets his equipment through [employer]  Allergic rhinitis Doing well on loratadine 10 mg daily. He has changed his fluticasone nasal spray to when necessary.  Baltazar Apo,  MD, PhD 05/16/2016, 9:41 AM [facility] [phone] or if no answer [phone]

## 2016-06-26 ENCOUNTER — Ambulatory Visit (INDEPENDENT_AMBULATORY_CARE_PROVIDER_SITE_OTHER): Payer: Medicare HMO | Admitting: Family Medicine

## 2016-06-26 ENCOUNTER — Encounter: Payer: Self-pay | Admitting: Family Medicine

## 2016-06-26 VITALS — BP 118/60 | HR 57 | Temp 97.1°F | Ht 67.0 in | Wt 209.0 lb

## 2016-06-26 DIAGNOSIS — J431 Panlobular emphysema: Secondary | ICD-10-CM | POA: Diagnosis not present

## 2016-06-26 DIAGNOSIS — G4733 Obstructive sleep apnea (adult) (pediatric): Secondary | ICD-10-CM | POA: Diagnosis not present

## 2016-06-26 DIAGNOSIS — I10 Essential (primary) hypertension: Secondary | ICD-10-CM | POA: Diagnosis not present

## 2016-06-26 DIAGNOSIS — R899 Unspecified abnormal finding in specimens from other organs, systems and tissues: Secondary | ICD-10-CM | POA: Diagnosis not present

## 2016-06-26 DIAGNOSIS — E785 Hyperlipidemia, unspecified: Secondary | ICD-10-CM

## 2016-06-26 MED ORDER — PSEUDOEPHEDRINE-GUAIFENESIN ER 60-600 MG PO TB12: 1.0000 | ORAL_TABLET | Freq: Two times a day (BID) | ORAL | 0 refills | Status: AC

## 2016-06-26 NOTE — Progress Notes (Signed)
Subjective:  Patient ID: Michiel Cowboy, male    DOB: June 23, 1934  Age: 82 y.o. MRN: [id number]  CC: Hyperlipidemia (pt here today for routine follow up of his chronic medical conditions and refills on his medications.)   HPI RIVEN MABILE presents for Patient in for follow-up of elevated cholesterol. Doing well without complaints on current medication. Denies side effects of statin including myalgia and arthralgia and nausea. Also in today for liver function testing. Currently no chest pain, shortness of breath or other cardiovascular related symptoms noted.  follow-up of hypertension. Patient has no history of headache chest pain or shortness of breath or recent cough. Patient also denies symptoms of TIA such as numbness weakness lateralizing. Patient denies side effects from his medication. States taking it regularly. Patient in for follow-up of GERD. Currently asymptomatic taking  PPI daily. There is no chest pain or heartburn. No hematemesis and no melena. No dysphagia or choking. Onset is remote. Progression is stable. Complicating factors, none. Patient says that he has not been using his nebulizer or rescue inhaler because he's doing so well. He does stay in the air conditioning to avoid excess heat outside these days. 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 He is using his CPAP regularly. Medications are as noted below. Denies any anxiety symptoms today.    History Dontavian has a past medical history of ASCVD (arteriosclerotic cardiovascular disease); COPD (chronic obstructive pulmonary disease) ([place]); Hyperlipidemia; Hypertension; and Vertigo.   He has a past surgical history that includes Cardiac surgery and Coronary artery bypass graft.   His family history includes Cancer in his brother, brother, and father; Healthy in his mother, sister, and sister.He reports that he quit smoking about 18 years ago. His smoking  use included Cigarettes. He has a 20.00 pack-year smoking history. He has never used smokeless tobacco. He reports that he drinks alcohol. He reports that he does not use drugs.    ROS Review of Systems  Constitutional: Negative for chills, diaphoresis, fever and unexpected weight change.  HENT: Positive for postnasal drip. Negative for congestion, hearing loss, rhinorrhea, sinus pressure and sore throat.   Eyes: Negative for visual disturbance.  Respiratory: Positive for cough (has been having some posterior drainage last few daysAnd when that gets down his throat it makes him cough a bit.). Negative for shortness of breath.   Cardiovascular: Negative for chest pain.  Gastrointestinal: Negative for abdominal pain, constipation and diarrhea.  Genitourinary: Negative for dysuria and flank pain.  Musculoskeletal: Negative for arthralgias and joint swelling.  Skin: Negative for rash.  Neurological: Negative for dizziness and headaches.  Psychiatric/Behavioral: Negative for dysphoric mood and sleep disturbance.    Objective:  BP 118/60   Pulse (!) 57   Temp 97.1 F (36.2 C) (Oral)   Ht _0  (1.702 m)   Wt 209 lb (94.8 kg)   BMI 32.73 kg/m   BP Readings from Last 3 Encounters:  06/26/16 118/60  05/16/16 118/60  11/05/15 131/71    Wt Readings from Last 3 Encounters:  06/26/16 209 lb (94.8 kg)  05/16/16 207 lb (93.9 kg)  11/05/15 213 lb 3.2 oz (96.7 kg)     Physical Exam  Constitutional: He is oriented to person, place, and time. He appears well-developed and well-nourished. No distress.  HENT:  Head: Normocephalic and atraumatic.  Right Ear: External ear normal.  Left Ear: External ear normal.  Nose: Nose normal.  Mouth/Throat: Oropharynx is clear and moist.  Eyes: Conjunctivae and EOM are normal. Pupils are equal, round, and reactive to light.  Neck: Normal range of motion. Neck supple. No thyromegaly present.  Cardiovascular: Normal rate, regular rhythm and normal  heart sounds.   No murmur heard. Pulmonary/Chest: Effort normal and breath sounds normal. No respiratory distress. He has no wheezes. He has no rales.  Abdominal: Soft. Bowel sounds are normal. He exhibits no distension. There is no tenderness.  Lymphadenopathy:    He has no cervical adenopathy.  Neurological: He is alert and oriented to person, place, and time. He has normal reflexes.  Skin: Skin is warm and dry.  Psychiatric: He has a normal mood and affect. His behavior is normal. Judgment and thought content normal.      Assessment & Plan:   Demetrius was seen today for hyperlipidemia.  Diagnoses and all orders for this visit:  Essential hypertension -     CMP14+EGFR  Hyperlipidemia with target LDL less than 100 -     CBC with Differential/Platelet -     CMP14+EGFR -     Lipid panel  Panlobular emphysema (HCC) -     CMP14+EGFR  OSA (obstructive sleep apnea) -     CMP14+EGFR  Other orders -     pseudoephedrine-guaifenesin (MUCINEX D) 60-600 MG 12 hr tablet; Take 1 tablet by mouth every 12 (twelve) hours. As needed for congestion       I have discontinued Mr. Medina's fluticasone. I am also having him start on pseudoephedrine-guaifenesin. Additionally, I am having him maintain his aspirin, folic acid, loratadine, albuterol, albuterol, sildenafil, calcium-vitamin D, multivitamin, colchicine, meclizine, betamethasone dipropionate, Fluticasone-Salmeterol, losartan-hydrochlorothiazide, metoprolol tartrate, simvastatin, omeprazole, and traZODone. We will stop administering pneumococcal 23 valent vaccine.  Allergies as of 06/26/2016      Reactions   Lipitor [atorvastatin]       Medication List       Accurate as of 06/26/16  9:48 AM. Always use your most recent med list.          albuterol 108 (90 Base) MCG/ACT inhaler Commonly known as:  PROVENTIL HFA;VENTOLIN HFA Inhale 2 puffs into the lungs every 4 (four) hours as needed for wheezing or shortness of breath.     albuterol (2.5 MG/3ML) 0.083% nebulizer solution Commonly known as:  PROVENTIL Take 3 mLs (2.5 mg total) by nebulization every 6 (six) hours as needed for wheezing or shortness of breath.   aspirin 81 MG tablet Take 81 mg by mouth daily.   betamethasone dipropionate 0.05 % cream Commonly known as:  DIPROLENE Apply topically 2 (two) times daily. to affected area   calcium-vitamin D 500-200 MG-UNIT tablet Take 1 tablet by mouth daily.   colchicine 0.6 MG tablet Take one po bid prn gout flare   Fluticasone-Salmeterol 250-50 MCG/DOSE Aepb Commonly known as:  ADVAIR DISKUS Inhale 1 puff by mouth 2 times a day   folic acid 825 MCG tablet Commonly known as:  FOLVITE Take 400 mcg by mouth daily.   loratadine 10 MG tablet Commonly known as:  CLARITIN Take 10 mg by mouth daily.   losartan-hydrochlorothiazide 50-12.5 MG tablet Commonly known as:  HYZAAR Take 1 tablet by mouth daily.   meclizine 25 MG tablet Commonly known as:  ANTIVERT TAKE ONE TABLET BY MOUTH THREE TIMES DAILY AS NEEDED FOR DIZZINESS   metoprolol tartrate 50 MG tablet Commonly known as:  LOPRESSOR Take 1 tablet (50 mg total) by mouth daily.   multivitamin tablet Take  1 tablet by mouth daily.   omeprazole 20 MG capsule Commonly known as:  PRILOSEC Take 1 Capsule by mouth once daily   pseudoephedrine-guaifenesin 60-600 MG 12 hr tablet Commonly known as:  MUCINEX D Take 1 tablet by mouth every 12 (twelve) hours. As needed for congestion   sildenafil 100 MG tablet Commonly known as:  VIAGRA Take 0.5-1 tablets (50-100 mg total) by mouth daily as needed for erectile dysfunction.   simvastatin 40 MG tablet Commonly known as:  ZOCOR Take 1 tablet (40 mg total) by mouth at bedtime.   traZODone 50 MG tablet Commonly known as:  DESYREL TAKE ONE TO TWO TABLETS BY MOUTH at bedtime AS NEEDED FOR SLEEP        Follow-up: No Follow-up on file.  Claretta Fraise, M.D.

## 2016-06-27 ENCOUNTER — Other Ambulatory Visit: Payer: Self-pay | Admitting: *Deleted

## 2016-06-27 DIAGNOSIS — D7589 Other specified diseases of blood and blood-forming organs: Secondary | ICD-10-CM | POA: Insufficient documentation

## 2016-06-27 LAB — CMP14+EGFR
ALT: 18 IU/L (ref 0–44)
AST: 10 IU/L (ref 0–40)
Albumin/Globulin Ratio: 2 (ref 1.2–2.2)
Albumin: 4.1 g/dL (ref 3.5–4.7)
Alkaline Phosphatase: 54 IU/L (ref 39–117)
BUN/Creatinine Ratio: 19 (ref 10–24)
BUN: 17 mg/dL (ref 8–27)
Bilirubin Total: 0.5 mg/dL (ref 0.0–1.2)
CO2: 24 mmol/L (ref 20–29)
Calcium: 9.1 mg/dL (ref 8.6–10.2)
Chloride: 102 mmol/L (ref 96–106)
Creatinine, Ser: 0.89 mg/dL (ref 0.76–1.27)
GFR calc Af Amer: 93 mL/min/{1.73_m2} (ref >59)
GFR calc non Af Amer: 81 mL/min/{1.73_m2} (ref >59)
Globulin, Total: 2.1 g/dL (ref 1.5–4.5)
Glucose: 104 mg/dL — ABNORMAL HIGH (ref 65–99)
Potassium: 4.7 mmol/L (ref 3.5–5.2)
Sodium: 139 mmol/L (ref 134–144)
Total Protein: 6.2 g/dL (ref 6.0–8.5)

## 2016-06-27 LAB — CBC WITH DIFFERENTIAL/PLATELET
Basophils Absolute: 0 10*3/uL (ref 0.0–0.2)
Basos: 1 %
EOS (ABSOLUTE): 0.6 10*3/uL — ABNORMAL HIGH (ref 0.0–0.4)
Eos: 10 %
Hematocrit: 41.4 % (ref 37.5–51.0)
Hemoglobin: 13.7 g/dL (ref 13.0–17.7)
Immature Grans (Abs): 0 10*3/uL (ref 0.0–0.1)
Immature Granulocytes: 0 %
Lymphocytes Absolute: 1.8 10*3/uL (ref 0.7–3.1)
Lymphs: 33 %
MCH: 34 pg — ABNORMAL HIGH (ref 26.6–33.0)
MCHC: 33.1 g/dL (ref 31.5–35.7)
MCV: 103 fL — ABNORMAL HIGH (ref 79–97)
Monocytes Absolute: 0.5 10*3/uL (ref 0.1–0.9)
Monocytes: 10 %
Neutrophils Absolute: 2.6 10*3/uL (ref 1.4–7.0)
Neutrophils: 46 %
Platelets: 258 10*3/uL (ref 150–379)
RBC: 4.03 x10E6/uL — ABNORMAL LOW (ref 4.14–5.80)
RDW: 12.4 % (ref 12.3–15.4)
WBC: 5.6 10*3/uL (ref 3.4–10.8)

## 2016-06-27 LAB — LIPID PANEL
Chol/HDL Ratio: 3.2 ratio (ref 0.0–5.0)
Cholesterol, Total: 153 mg/dL (ref 100–199)
HDL: 48 mg/dL (ref >39)
LDL Calculated: 79 mg/dL (ref 0–99)
Triglycerides: 132 mg/dL (ref 0–149)
VLDL Cholesterol Cal: 26 mg/dL (ref 5–40)

## 2016-06-29 LAB — SPECIMEN STATUS REPORT

## 2016-06-29 LAB — VITAMIN B12: Vitamin B-12: 352 pg/mL (ref 232–1245)

## 2016-07-11 DIAGNOSIS — G4733 Obstructive sleep apnea (adult) (pediatric): Secondary | ICD-10-CM | POA: Diagnosis not present

## 2016-07-14 ENCOUNTER — Other Ambulatory Visit: Payer: Self-pay | Admitting: Physician Assistant

## 2016-07-14 DIAGNOSIS — K219 Gastro-esophageal reflux disease without esophagitis: Secondary | ICD-10-CM

## 2016-07-23 IMAGING — CR DG CHEST 2V
2 series · 2 of 2 positions shown · non-contrast
Comparison: None.

CLINICAL DATA: History of COPD, short of breath with exertion

EXAM:
CHEST  2 VIEW

[view not recorded (1 of 2)]
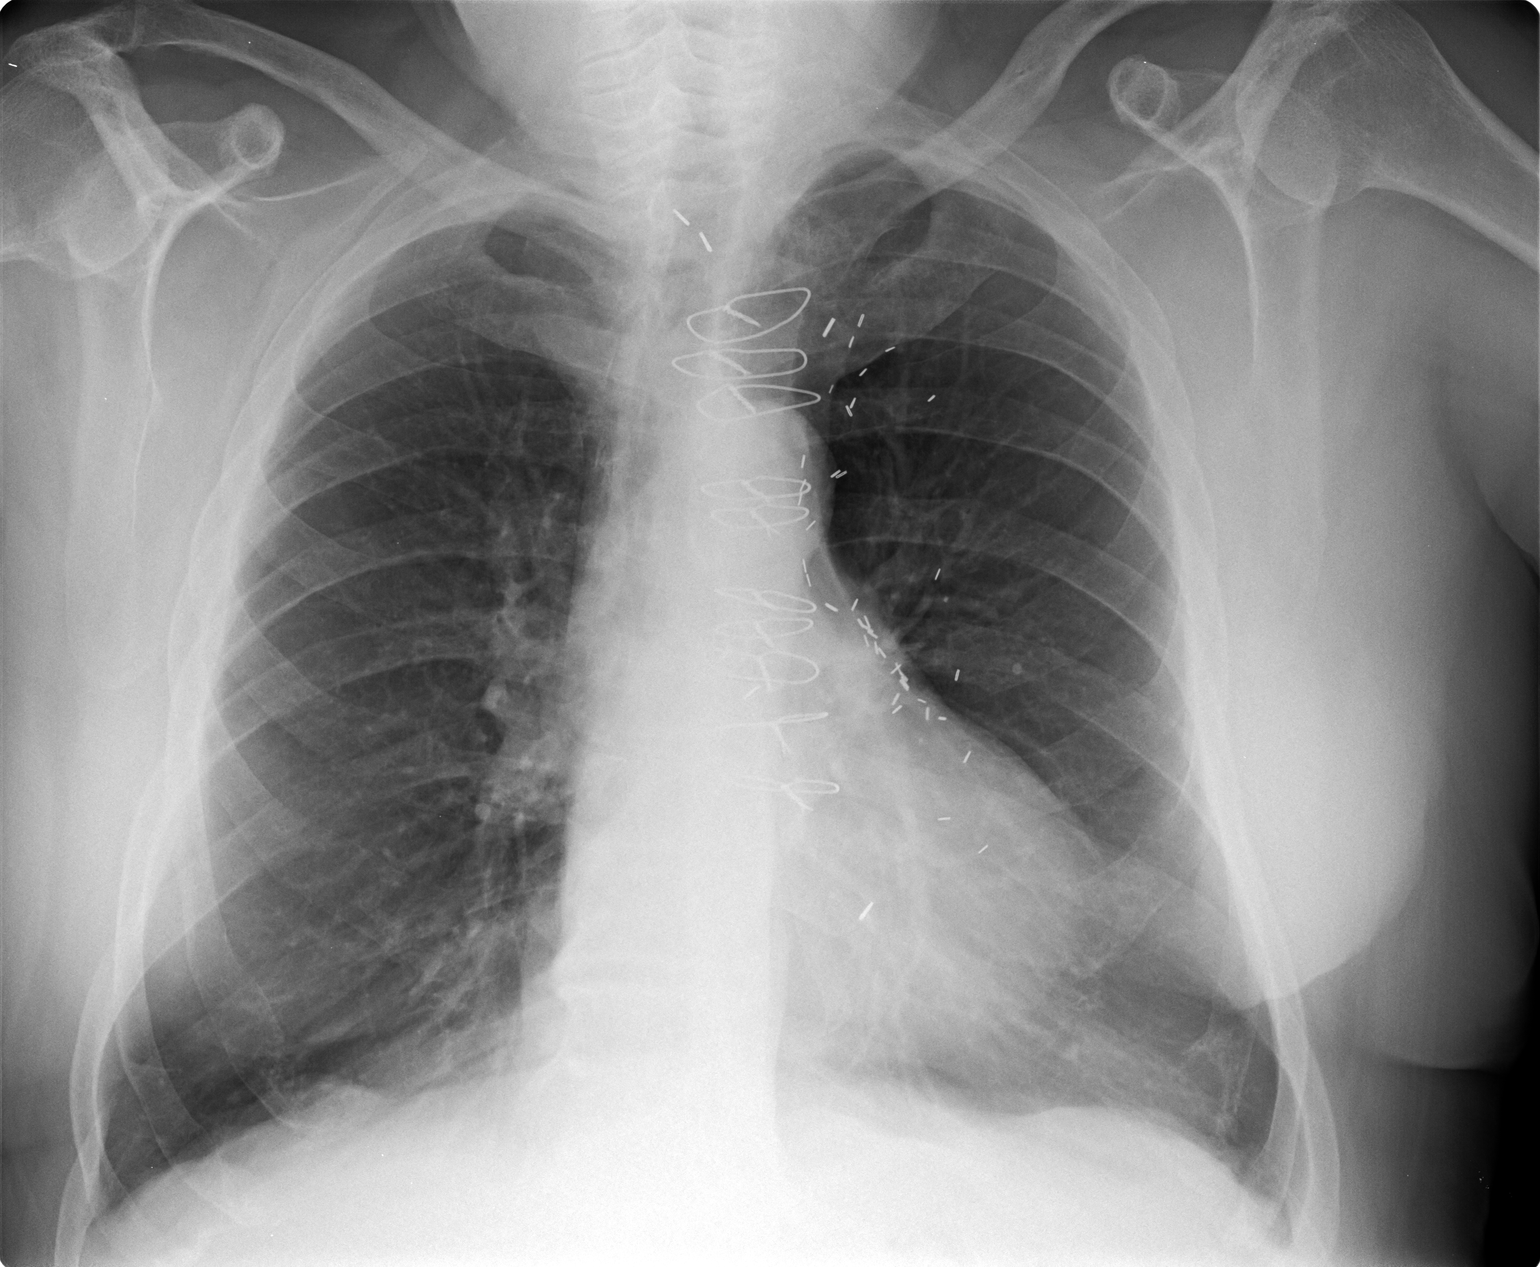

[view not recorded (2 of 2)]
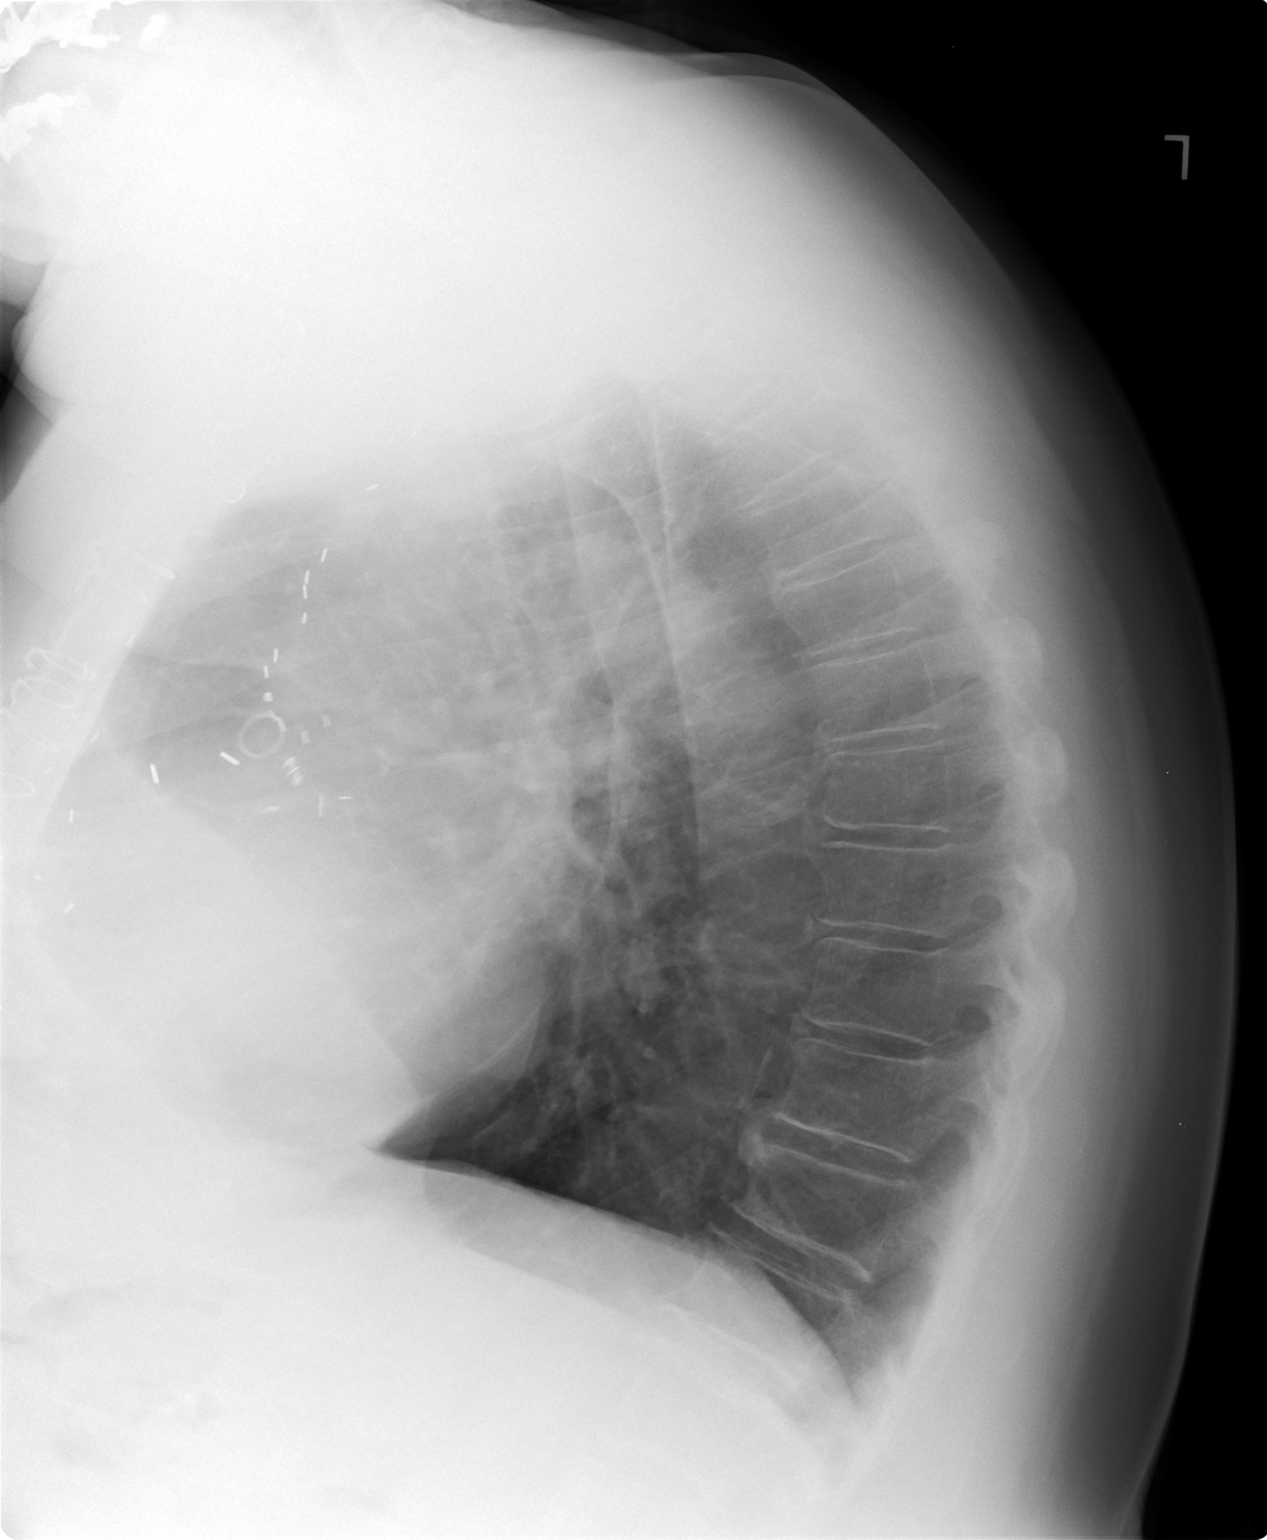

[2 of 2 positions shown; findings below may reference images not displayed]

FINDINGS: No active infiltrate or effusion is seen. The lungs are hyperaerated
with flattened hemidiaphragms suggesting an element of emphysema.
Mediastinal and hilar contours are unremarkable. The heart is mildly
enlarged. Median sternotomy sutures are noted from prior CABG. The
no acute bony abnormality is seen.
IMPRESSION: Hyper aeration may indicate emphysema.  No active lung disease.

## 2016-08-18 ENCOUNTER — Other Ambulatory Visit: Payer: Self-pay | Admitting: Family Medicine

## 2016-08-18 ENCOUNTER — Ambulatory Visit (INDEPENDENT_AMBULATORY_CARE_PROVIDER_SITE_OTHER): Payer: Medicare HMO | Admitting: Family Medicine

## 2016-08-18 VITALS — BP 127/74 | HR 70 | Temp 99.2°F | Ht 67.0 in | Wt 200.0 lb

## 2016-08-18 DIAGNOSIS — S91011A Laceration without foreign body, right ankle, initial encounter: Secondary | ICD-10-CM

## 2016-08-18 DIAGNOSIS — Z23 Encounter for immunization: Secondary | ICD-10-CM

## 2016-08-18 DIAGNOSIS — E785 Hyperlipidemia, unspecified: Secondary | ICD-10-CM

## 2016-08-22 NOTE — Progress Notes (Signed)
BP 127/74   Pulse 70   Temp 99.2 F (37.3 C) (Oral)   Ht 5\' 7"  (1.702 m)   Wt 200 lb (90.7 kg)   BMI 31.32 kg/m    Subjective:    Patient ID: Matthew Franklin, male    DOB: 05/28/1934, 82 y.o.   MRN: [id number]  HPI: Matthew Franklin is a 82 y.o. male presenting on 08/18/2016 for Laceration (right ankle laceration, fell through barn floor)   HPI Right ankle laceration Patient comes in today with complaints of right ankle laceration that he sustained earlier today. He fell through his buck barn floor. He grabs at the time came into our office to have it examined. He denies any redness or warmth or numbness or weakness in that foot. He is able to ambulate" weight on it just fine and does not feel like he fractured anything. He says that is gone for a just been weak and he put his foot through one of the week and floor boards.  Relevant past medical, surgical, family and social history reviewed and updated as indicated. Interim medical history since our last visit reviewed. Allergies and medications reviewed and updated.  Review of Systems  Constitutional: Negative for chills and fever.  Eyes: Negative for discharge.  Respiratory: Negative for shortness of breath and wheezing.   Cardiovascular: Negative for chest pain and leg swelling.  Musculoskeletal: Negative for arthralgias, back pain and gait problem.  Skin: Positive for wound. Negative for rash.  All other systems reviewed and are negative.   Per HPI unless specifically indicated above     Objective:    BP 127/74   Pulse 70   Temp 99.2 F (37.3 C) (Oral)   Ht 5\' 7"  (1.702 m)   Wt 200 lb (90.7 kg)   BMI 31.32 kg/m   Wt Readings from Last 3 Encounters:  08/18/16 200 lb (90.7 kg)  06/26/16 209 lb (94.8 kg)  05/16/16 207 lb (93.9 kg)    Physical Exam  Constitutional: He is oriented to person, place, and time. He appears well-developed and well-nourished. No distress.  Eyes: Conjunctivae are normal.    Cardiovascular: Normal rate, regular rhythm, normal heart sounds and intact distal pulses.   No murmur heard. Pulmonary/Chest: Effort normal and breath sounds normal. No respiratory distress. He has no wheezes. He has no rales.  Musculoskeletal: Normal range of motion.  Neurological: He is alert and oriented to person, place, and time. Coordination normal.  Skin: Skin is warm and dry. Laceration (3 cm area where the skin has been removed on right inner ankle, area is not very deeply lacerated and is mostly just superficial just below the level of the skin. The skin is very thin and pliable.) noted. No rash noted. He is not diaphoretic.  Psychiatric: He has a normal mood and affect. His behavior is normal.  Nursing note and vitals reviewed.     Assessment & Plan:   Problem List Items Addressed This Visit    None    Visit Diagnoses    Laceration of right ankle, initial encounter    -  Primary      Place pressure bandage over area on the ankle that was affected. Skin was friable and mostly ripped off but not very deeply cut, we'll heal by secondary intention. Recommended to change dressings every evening and watch for signs of infection  Follow up plan: Return if symptoms worsen or fail to improve.  Counseling provided for all of the  vaccine components Orders Placed This Encounter  Procedures  . Td vaccine greater than or equal to 7yo preservative free IM    Caryl Pina, MD [facility] 08/22/2016, 10:36 PM

## 2016-08-25 ENCOUNTER — Encounter: Payer: Self-pay | Admitting: Family Medicine

## 2016-08-25 ENCOUNTER — Ambulatory Visit (INDEPENDENT_AMBULATORY_CARE_PROVIDER_SITE_OTHER): Payer: Medicare HMO | Admitting: Family Medicine

## 2016-08-25 VITALS — BP 130/67 | HR 55 | Temp 97.4°F | Ht 67.0 in | Wt 206.0 lb

## 2016-08-25 DIAGNOSIS — S90511D Abrasion, right ankle, subsequent encounter: Secondary | ICD-10-CM

## 2016-08-25 DIAGNOSIS — S90511A Abrasion, right ankle, initial encounter: Secondary | ICD-10-CM

## 2016-08-25 NOTE — Progress Notes (Signed)
Subjective:  Patient ID: Matthew Franklin, male    DOB: Jun 20, 1934  Age: 82 y.o. MRN: [id number]  CC: Follow-up (pt here today following up on laceration and says he is doing great.)   HPI TRITON HEIDRICH presents for Recheck of laceration.Seen 3 days ago and decision made not to suture due to the condition of the skin. Since then they've been cleaning it daily with hydrogen peroxide and applying salve. It has continued to be sore but not painful swollen or having some grating redness from it.  Depression screen [facility] 2/9 08/25/2016 06/26/2016 11/05/2015  Decreased Interest 0 0 0  Down, Depressed, Hopeless 0 0 0  PHQ - 2 Score 0 0 0    History Giavanni has a past medical history of ASCVD (arteriosclerotic cardiovascular disease); COPD (chronic obstructive pulmonary disease) ([place]); Hyperlipidemia; Hypertension; and Vertigo.   He has a past surgical history that includes Cardiac surgery and Coronary artery bypass graft.   His family history includes Cancer in his brother, brother, and father; Healthy in his mother, sister, and sister.He reports that he quit smoking about 18 years ago. His smoking use included Cigarettes. He has a 20.00 pack-year smoking history. He has never used smokeless tobacco. He reports that he drinks alcohol. He reports that he does not use drugs.    ROS Review of Systems Noncontributory Objective:  BP 130/67   Pulse (!) 55   Temp (!) 97.4 F (36.3 C) (Oral)   Ht 5\' 7"  (1.702 m)   Wt 206 lb (93.4 kg)   BMI 32.26 kg/m   BP Readings from Last 3 Encounters:  08/25/16 130/67  08/18/16 127/74  06/26/16 118/60    Wt Readings from Last 3 Encounters:  08/25/16 206 lb (93.4 kg)  08/18/16 200 lb (90.7 kg)  06/26/16 209 lb (94.8 kg)     Physical Exam   Above-noted lesion on the right lower extremity is noted to be 2 x 4 cm. It is into the dermis. There is moderate slough. No signs of infection.   Assessment & Plan:   There are no diagnoses linked to this  encounter.     I am having Mr. Colee maintain his aspirin, folic acid, loratadine, albuterol, albuterol, sildenafil, calcium-vitamin D, multivitamin, colchicine, meclizine, betamethasone dipropionate, Fluticasone-Salmeterol, losartan-hydrochlorothiazide, metoprolol tartrate, traZODone, omeprazole, and simvastatin.  Allergies as of 08/25/2016      Reactions   Lipitor [atorvastatin]       Medication List       Accurate as of 08/25/16 11:59 PM. Always use your most recent med list.          albuterol 108 (90 Base) MCG/ACT inhaler Commonly known as:  PROVENTIL HFA;VENTOLIN HFA Inhale 2 puffs into the lungs every 4 (four) hours as needed for wheezing or shortness of breath.   albuterol (2.5 MG/3ML) 0.083% nebulizer solution Commonly known as:  PROVENTIL Take 3 mLs (2.5 mg total) by nebulization every 6 (six) hours as needed for wheezing or shortness of breath.   aspirin 81 MG tablet Take 81 mg by mouth daily.   betamethasone dipropionate 0.05 % cream Commonly known as:  DIPROLENE Apply topically 2 (two) times daily. to affected area   calcium-vitamin D 500-200 MG-UNIT tablet Take 1 tablet by mouth daily.   colchicine 0.6 MG tablet Take one po bid prn gout flare   Fluticasone-Salmeterol 250-50 MCG/DOSE Aepb Commonly known as:  ADVAIR DISKUS Inhale 1 puff by mouth 2 times a day   folic acid 854 MCG  tablet Commonly known as:  FOLVITE Take 400 mcg by mouth daily.   loratadine 10 MG tablet Commonly known as:  CLARITIN Take 10 mg by mouth daily.   losartan-hydrochlorothiazide 50-12.5 MG tablet Commonly known as:  HYZAAR Take 1 tablet by mouth daily.   meclizine 25 MG tablet Commonly known as:  ANTIVERT TAKE ONE TABLET BY MOUTH THREE TIMES DAILY AS NEEDED FOR DIZZINESS   metoprolol tartrate 50 MG tablet Commonly known as:  LOPRESSOR Take 1 tablet (50 mg total) by mouth daily.   multivitamin tablet Take 1 tablet by mouth daily.   omeprazole 20 MG  capsule Commonly known as:  PRILOSEC Take 1 Capsule by mouth once daily   sildenafil 100 MG tablet Commonly known as:  VIAGRA Take 0.5-1 tablets (50-100 mg total) by mouth daily as needed for erectile dysfunction.   simvastatin 40 MG tablet Commonly known as:  ZOCOR Take 1 Tablet by mouth at bedtime   traZODone 50 MG tablet Commonly known as:  DESYREL TAKE ONE TO TWO TABLETS BY MOUTH at bedtime AS NEEDED FOR SLEEP      Wound care reviewed. Cleanse daily with warm soap and water rinse with clear water. He should apply an ointment. Cover with gauze. Do not use hydrogen peroxide unless it gets dirty.  Follow-up: No Follow-up on file.  Claretta Fraise, M.D.

## 2016-08-29 DIAGNOSIS — Z85828 Personal history of other malignant neoplasm of skin: Secondary | ICD-10-CM | POA: Diagnosis not present

## 2016-08-29 DIAGNOSIS — L570 Actinic keratosis: Secondary | ICD-10-CM | POA: Diagnosis not present

## 2016-09-01 ENCOUNTER — Other Ambulatory Visit: Payer: Self-pay | Admitting: Family Medicine

## 2016-09-01 DIAGNOSIS — I10 Essential (primary) hypertension: Secondary | ICD-10-CM

## 2016-10-11 DIAGNOSIS — G4733 Obstructive sleep apnea (adult) (pediatric): Secondary | ICD-10-CM | POA: Diagnosis not present

## 2016-10-26 ENCOUNTER — Ambulatory Visit (INDEPENDENT_AMBULATORY_CARE_PROVIDER_SITE_OTHER): Payer: Medicare HMO

## 2016-10-26 DIAGNOSIS — Z23 Encounter for immunization: Secondary | ICD-10-CM | POA: Diagnosis not present

## 2016-11-19 DIAGNOSIS — Z87891 Personal history of nicotine dependence: Secondary | ICD-10-CM | POA: Diagnosis not present

## 2016-11-19 DIAGNOSIS — R05 Cough: Secondary | ICD-10-CM | POA: Diagnosis not present

## 2016-11-19 DIAGNOSIS — R062 Wheezing: Secondary | ICD-10-CM | POA: Diagnosis not present

## 2016-11-19 DIAGNOSIS — J441 Chronic obstructive pulmonary disease with (acute) exacerbation: Secondary | ICD-10-CM | POA: Diagnosis not present

## 2016-11-19 DIAGNOSIS — J189 Pneumonia, unspecified organism: Secondary | ICD-10-CM | POA: Diagnosis not present

## 2016-11-19 DIAGNOSIS — M109 Gout, unspecified: Secondary | ICD-10-CM | POA: Diagnosis not present

## 2016-11-19 DIAGNOSIS — J069 Acute upper respiratory infection, unspecified: Secondary | ICD-10-CM | POA: Diagnosis not present

## 2016-11-19 DIAGNOSIS — J9601 Acute respiratory failure with hypoxia: Secondary | ICD-10-CM | POA: Diagnosis not present

## 2016-11-19 DIAGNOSIS — I10 Essential (primary) hypertension: Secondary | ICD-10-CM | POA: Diagnosis not present

## 2016-11-19 DIAGNOSIS — F419 Anxiety disorder, unspecified: Secondary | ICD-10-CM | POA: Diagnosis not present

## 2016-11-19 DIAGNOSIS — I2510 Atherosclerotic heart disease of native coronary artery without angina pectoris: Secondary | ICD-10-CM | POA: Diagnosis not present

## 2016-11-19 DIAGNOSIS — J440 Chronic obstructive pulmonary disease with acute lower respiratory infection: Secondary | ICD-10-CM | POA: Diagnosis not present

## 2016-11-19 DIAGNOSIS — R0602 Shortness of breath: Secondary | ICD-10-CM | POA: Diagnosis not present

## 2016-11-20 DIAGNOSIS — J441 Chronic obstructive pulmonary disease with (acute) exacerbation: Secondary | ICD-10-CM | POA: Diagnosis not present

## 2016-11-20 DIAGNOSIS — R0682 Tachypnea, not elsewhere classified: Secondary | ICD-10-CM | POA: Diagnosis not present

## 2016-11-20 DIAGNOSIS — J9601 Acute respiratory failure with hypoxia: Secondary | ICD-10-CM | POA: Diagnosis not present

## 2016-11-20 DIAGNOSIS — J189 Pneumonia, unspecified organism: Secondary | ICD-10-CM | POA: Diagnosis not present

## 2016-11-20 DIAGNOSIS — R509 Fever, unspecified: Secondary | ICD-10-CM | POA: Diagnosis not present

## 2016-11-21 DIAGNOSIS — R0682 Tachypnea, not elsewhere classified: Secondary | ICD-10-CM | POA: Diagnosis not present

## 2016-11-21 DIAGNOSIS — J441 Chronic obstructive pulmonary disease with (acute) exacerbation: Secondary | ICD-10-CM | POA: Diagnosis not present

## 2016-11-21 DIAGNOSIS — J9601 Acute respiratory failure with hypoxia: Secondary | ICD-10-CM | POA: Diagnosis not present

## 2016-11-21 DIAGNOSIS — R509 Fever, unspecified: Secondary | ICD-10-CM | POA: Diagnosis not present

## 2016-11-21 DIAGNOSIS — J189 Pneumonia, unspecified organism: Secondary | ICD-10-CM | POA: Diagnosis not present

## 2016-12-14 DIAGNOSIS — R4182 Altered mental status, unspecified: Secondary | ICD-10-CM | POA: Diagnosis not present

## 2016-12-14 DIAGNOSIS — R2681 Unsteadiness on feet: Secondary | ICD-10-CM | POA: Diagnosis not present

## 2016-12-14 DIAGNOSIS — Z09 Encounter for follow-up examination after completed treatment for conditions other than malignant neoplasm: Secondary | ICD-10-CM | POA: Diagnosis not present

## 2016-12-14 DIAGNOSIS — N390 Urinary tract infection, site not specified: Secondary | ICD-10-CM | POA: Diagnosis not present

## 2016-12-14 DIAGNOSIS — B9620 Unspecified Escherichia coli [E. coli] as the cause of diseases classified elsewhere: Secondary | ICD-10-CM | POA: Diagnosis not present

## 2016-12-14 DIAGNOSIS — R040 Epistaxis: Secondary | ICD-10-CM | POA: Diagnosis not present

## 2016-12-14 DIAGNOSIS — R4781 Slurred speech: Secondary | ICD-10-CM | POA: Diagnosis not present

## 2016-12-18 DIAGNOSIS — J441 Chronic obstructive pulmonary disease with (acute) exacerbation: Secondary | ICD-10-CM | POA: Diagnosis not present

## 2016-12-18 DIAGNOSIS — I10 Essential (primary) hypertension: Secondary | ICD-10-CM | POA: Diagnosis not present

## 2016-12-18 DIAGNOSIS — J189 Pneumonia, unspecified organism: Secondary | ICD-10-CM | POA: Diagnosis not present

## 2016-12-18 DIAGNOSIS — Z09 Encounter for follow-up examination after completed treatment for conditions other than malignant neoplasm: Secondary | ICD-10-CM | POA: Diagnosis not present

## 2016-12-18 DIAGNOSIS — J449 Chronic obstructive pulmonary disease, unspecified: Secondary | ICD-10-CM | POA: Diagnosis not present

## 2017-01-06 ENCOUNTER — Other Ambulatory Visit: Payer: Self-pay | Admitting: Family Medicine

## 2017-01-06 DIAGNOSIS — E785 Hyperlipidemia, unspecified: Secondary | ICD-10-CM

## 2017-01-10 ENCOUNTER — Other Ambulatory Visit: Payer: Self-pay | Admitting: *Deleted

## 2017-01-10 DIAGNOSIS — G4700 Insomnia, unspecified: Secondary | ICD-10-CM | POA: Diagnosis not present

## 2017-01-10 DIAGNOSIS — K219 Gastro-esophageal reflux disease without esophagitis: Secondary | ICD-10-CM | POA: Diagnosis not present

## 2017-01-10 DIAGNOSIS — E785 Hyperlipidemia, unspecified: Secondary | ICD-10-CM | POA: Diagnosis not present

## 2017-01-10 DIAGNOSIS — M109 Gout, unspecified: Secondary | ICD-10-CM | POA: Diagnosis not present

## 2017-01-10 DIAGNOSIS — Z87891 Personal history of nicotine dependence: Secondary | ICD-10-CM | POA: Diagnosis not present

## 2017-01-10 DIAGNOSIS — I10 Essential (primary) hypertension: Secondary | ICD-10-CM | POA: Diagnosis not present

## 2017-01-10 DIAGNOSIS — I2510 Atherosclerotic heart disease of native coronary artery without angina pectoris: Secondary | ICD-10-CM | POA: Diagnosis not present

## 2017-01-10 DIAGNOSIS — J309 Allergic rhinitis, unspecified: Secondary | ICD-10-CM | POA: Diagnosis not present

## 2017-01-10 DIAGNOSIS — J449 Chronic obstructive pulmonary disease, unspecified: Secondary | ICD-10-CM | POA: Diagnosis not present

## 2017-01-10 MED ORDER — FLUTICASONE-SALMETEROL 250-50 MCG/DOSE IN AEPB: INHALATION_SPRAY | RESPIRATORY_TRACT | 0 refills | Status: DC

## 2017-01-12 ENCOUNTER — Other Ambulatory Visit: Payer: Self-pay | Admitting: *Deleted

## 2017-01-12 DIAGNOSIS — G4733 Obstructive sleep apnea (adult) (pediatric): Secondary | ICD-10-CM | POA: Diagnosis not present

## 2017-01-12 MED ORDER — FLUTICASONE-SALMETEROL 250-50 MCG/DOSE IN AEPB: INHALATION_SPRAY | RESPIRATORY_TRACT | 0 refills | Status: DC

## 2017-02-02 ENCOUNTER — Other Ambulatory Visit: Payer: Self-pay | Admitting: *Deleted

## 2017-02-02 DIAGNOSIS — K219 Gastro-esophageal reflux disease without esophagitis: Secondary | ICD-10-CM

## 2017-02-02 MED ORDER — OMEPRAZOLE 20 MG PO CPDR
20.0000 mg | DELAYED_RELEASE_CAPSULE | Freq: Every day | ORAL | 0 refills | Status: DC
Start: 2017-02-02 — End: 2017-03-04

## 2017-02-26 DIAGNOSIS — L570 Actinic keratosis: Secondary | ICD-10-CM | POA: Diagnosis not present

## 2017-03-04 ENCOUNTER — Other Ambulatory Visit: Payer: Self-pay | Admitting: Family Medicine

## 2017-03-04 DIAGNOSIS — K219 Gastro-esophageal reflux disease without esophagitis: Secondary | ICD-10-CM

## 2017-03-29 ENCOUNTER — Other Ambulatory Visit: Payer: Self-pay | Admitting: Family Medicine

## 2017-03-29 DIAGNOSIS — K219 Gastro-esophageal reflux disease without esophagitis: Secondary | ICD-10-CM

## 2017-03-30 NOTE — Telephone Encounter (Signed)
Last seen 08/25/16  Dr Livia Snellen

## 2017-04-03 DIAGNOSIS — J449 Chronic obstructive pulmonary disease, unspecified: Secondary | ICD-10-CM | POA: Diagnosis not present

## 2017-04-03 DIAGNOSIS — Z0000 Encounter for general adult medical examination without abnormal findings: Secondary | ICD-10-CM | POA: Diagnosis not present

## 2017-04-03 DIAGNOSIS — K219 Gastro-esophageal reflux disease without esophagitis: Secondary | ICD-10-CM | POA: Diagnosis not present

## 2017-04-03 DIAGNOSIS — G4700 Insomnia, unspecified: Secondary | ICD-10-CM | POA: Diagnosis not present

## 2017-04-03 DIAGNOSIS — I2510 Atherosclerotic heart disease of native coronary artery without angina pectoris: Secondary | ICD-10-CM | POA: Diagnosis not present

## 2017-04-03 DIAGNOSIS — E785 Hyperlipidemia, unspecified: Secondary | ICD-10-CM | POA: Diagnosis not present

## 2017-04-03 DIAGNOSIS — M109 Gout, unspecified: Secondary | ICD-10-CM | POA: Diagnosis not present

## 2017-04-03 DIAGNOSIS — I10 Essential (primary) hypertension: Secondary | ICD-10-CM | POA: Diagnosis not present

## 2017-04-12 DIAGNOSIS — G4733 Obstructive sleep apnea (adult) (pediatric): Secondary | ICD-10-CM | POA: Diagnosis not present

## 2017-04-22 ENCOUNTER — Other Ambulatory Visit: Payer: Self-pay | Admitting: Family Medicine

## 2017-04-22 DIAGNOSIS — K219 Gastro-esophageal reflux disease without esophagitis: Secondary | ICD-10-CM

## 2017-05-28 ENCOUNTER — Other Ambulatory Visit: Payer: Self-pay | Admitting: Family Medicine

## 2017-05-28 DIAGNOSIS — E785 Hyperlipidemia, unspecified: Secondary | ICD-10-CM

## 2017-05-29 NOTE — Telephone Encounter (Signed)
Authorize 30 days only. Then contact the patient letting them know that they will need an appointment before any further prescriptions can be sent in. 

## 2017-05-29 NOTE — Telephone Encounter (Signed)
Spoke with pt's wife who states pt no longer comes here. Rx refill refused per pt request.

## 2017-05-29 NOTE — Telephone Encounter (Signed)
Last lipid 06/26/16

## 2017-06-30 ENCOUNTER — Other Ambulatory Visit: Payer: Self-pay | Admitting: Family Medicine

## 2017-07-13 DIAGNOSIS — G4733 Obstructive sleep apnea (adult) (pediatric): Secondary | ICD-10-CM | POA: Diagnosis not present

## 2017-08-13 DIAGNOSIS — J449 Chronic obstructive pulmonary disease, unspecified: Secondary | ICD-10-CM | POA: Diagnosis not present

## 2017-08-13 DIAGNOSIS — I10 Essential (primary) hypertension: Secondary | ICD-10-CM | POA: Diagnosis not present

## 2017-08-13 DIAGNOSIS — M109 Gout, unspecified: Secondary | ICD-10-CM | POA: Diagnosis not present

## 2017-08-13 DIAGNOSIS — I2510 Atherosclerotic heart disease of native coronary artery without angina pectoris: Secondary | ICD-10-CM | POA: Diagnosis not present

## 2017-08-13 DIAGNOSIS — K219 Gastro-esophageal reflux disease without esophagitis: Secondary | ICD-10-CM | POA: Diagnosis not present

## 2017-08-13 DIAGNOSIS — G4700 Insomnia, unspecified: Secondary | ICD-10-CM | POA: Diagnosis not present

## 2017-08-13 DIAGNOSIS — E669 Obesity, unspecified: Secondary | ICD-10-CM | POA: Diagnosis not present

## 2017-08-13 DIAGNOSIS — E785 Hyperlipidemia, unspecified: Secondary | ICD-10-CM | POA: Diagnosis not present

## 2017-08-15 DIAGNOSIS — I10 Essential (primary) hypertension: Secondary | ICD-10-CM | POA: Diagnosis not present

## 2017-08-15 DIAGNOSIS — E785 Hyperlipidemia, unspecified: Secondary | ICD-10-CM | POA: Diagnosis not present

## 2017-08-15 DIAGNOSIS — J189 Pneumonia, unspecified organism: Secondary | ICD-10-CM | POA: Diagnosis not present

## 2017-08-15 DIAGNOSIS — J441 Chronic obstructive pulmonary disease with (acute) exacerbation: Secondary | ICD-10-CM | POA: Diagnosis not present

## 2017-08-28 DIAGNOSIS — Z85828 Personal history of other malignant neoplasm of skin: Secondary | ICD-10-CM | POA: Diagnosis not present

## 2017-08-28 DIAGNOSIS — L309 Dermatitis, unspecified: Secondary | ICD-10-CM | POA: Diagnosis not present

## 2017-08-28 DIAGNOSIS — L989 Disorder of the skin and subcutaneous tissue, unspecified: Secondary | ICD-10-CM | POA: Diagnosis not present

## 2017-08-28 DIAGNOSIS — D485 Neoplasm of uncertain behavior of skin: Secondary | ICD-10-CM | POA: Diagnosis not present

## 2017-08-28 DIAGNOSIS — L570 Actinic keratosis: Secondary | ICD-10-CM | POA: Diagnosis not present

## 2017-10-12 ENCOUNTER — Other Ambulatory Visit: Payer: Self-pay | Admitting: Family Medicine

## 2017-10-12 NOTE — Telephone Encounter (Signed)
Patient no longer seen at this office

## 2017-10-12 NOTE — Telephone Encounter (Signed)
Last seen 08/25/16  Dr Livia Snellen  Need an appt

## 2017-10-15 DIAGNOSIS — G4733 Obstructive sleep apnea (adult) (pediatric): Secondary | ICD-10-CM | POA: Diagnosis not present

## 2017-11-27 DIAGNOSIS — J40 Bronchitis, not specified as acute or chronic: Secondary | ICD-10-CM | POA: Diagnosis not present

## 2018-01-15 DIAGNOSIS — G4733 Obstructive sleep apnea (adult) (pediatric): Secondary | ICD-10-CM | POA: Diagnosis not present

## 2018-02-05 DIAGNOSIS — J449 Chronic obstructive pulmonary disease, unspecified: Secondary | ICD-10-CM | POA: Diagnosis not present

## 2018-02-25 DIAGNOSIS — L570 Actinic keratosis: Secondary | ICD-10-CM | POA: Diagnosis not present

## 2018-02-25 DIAGNOSIS — L309 Dermatitis, unspecified: Secondary | ICD-10-CM | POA: Diagnosis not present

## 2018-02-28 DIAGNOSIS — J40 Bronchitis, not specified as acute or chronic: Secondary | ICD-10-CM | POA: Diagnosis not present

## 2018-02-28 DIAGNOSIS — J449 Chronic obstructive pulmonary disease, unspecified: Secondary | ICD-10-CM | POA: Diagnosis not present

## 2018-03-08 DIAGNOSIS — J449 Chronic obstructive pulmonary disease, unspecified: Secondary | ICD-10-CM | POA: Diagnosis not present

## 2018-03-08 DIAGNOSIS — J9611 Chronic respiratory failure with hypoxia: Secondary | ICD-10-CM | POA: Diagnosis not present

## 2018-03-15 DIAGNOSIS — R0602 Shortness of breath: Secondary | ICD-10-CM | POA: Diagnosis not present

## 2018-03-15 DIAGNOSIS — Z9989 Dependence on other enabling machines and devices: Secondary | ICD-10-CM | POA: Diagnosis not present

## 2018-03-15 DIAGNOSIS — I10 Essential (primary) hypertension: Secondary | ICD-10-CM | POA: Diagnosis not present

## 2018-03-15 DIAGNOSIS — G4730 Sleep apnea, unspecified: Secondary | ICD-10-CM | POA: Diagnosis not present

## 2018-03-15 DIAGNOSIS — I2510 Atherosclerotic heart disease of native coronary artery without angina pectoris: Secondary | ICD-10-CM | POA: Diagnosis not present

## 2018-03-15 DIAGNOSIS — J441 Chronic obstructive pulmonary disease with (acute) exacerbation: Secondary | ICD-10-CM | POA: Diagnosis not present

## 2018-03-15 DIAGNOSIS — E785 Hyperlipidemia, unspecified: Secondary | ICD-10-CM | POA: Diagnosis not present

## 2018-04-16 DIAGNOSIS — G4733 Obstructive sleep apnea (adult) (pediatric): Secondary | ICD-10-CM | POA: Diagnosis not present

## 2020-01-26 ENCOUNTER — Telehealth: Payer: Self-pay | Admitting: Emergency Medicine

## 2020-01-26 NOTE — Telephone Encounter (Signed)
Spoke with Vaughan Basta and made appt with Dr Elsworth Soho for 03/02/20.

## 2020-01-26 NOTE — Telephone Encounter (Signed)
Patient was last seen 05/16/2016 and will need to have an office visit

## 2020-03-02 ENCOUNTER — Ambulatory Visit (INDEPENDENT_AMBULATORY_CARE_PROVIDER_SITE_OTHER): Payer: Medicare HMO | Admitting: Pulmonary Disease

## 2020-03-02 ENCOUNTER — Encounter: Payer: Self-pay | Admitting: Pulmonary Disease

## 2020-03-02 ENCOUNTER — Other Ambulatory Visit: Payer: Self-pay

## 2020-03-02 VITALS — BP 120/74 | HR 74 | Temp 98.2°F | Ht 67.0 in | Wt 190.6 lb

## 2020-03-02 DIAGNOSIS — G4733 Obstructive sleep apnea (adult) (pediatric): Secondary | ICD-10-CM | POA: Diagnosis not present

## 2020-03-02 DIAGNOSIS — J432 Centrilobular emphysema: Secondary | ICD-10-CM

## 2020-03-02 NOTE — Assessment & Plan Note (Signed)
We will send a prescription for auto CPAP 5 to 15 cm to [facility].  We will obtain download after 1 month of using his machine and fine-tune settings as needed. He is very compliant with CPAP and this is certainly helped improve his daytime somnolence and fatigue

## 2020-03-02 NOTE — Patient Instructions (Signed)
  We will obtain your settings from [place] and send in prescription for new CPAP CPAP supplies will be renewed for a year.  Continue Trelegy

## 2020-03-02 NOTE — Assessment & Plan Note (Signed)
Continue Trelegy.  He has done well on this with reduced exacerbations Infrequent use of SABA

## 2020-03-02 NOTE — Progress Notes (Signed)
Subjective:    Patient ID: Matthew Franklin, male    DOB: 1934-11-25, 86 y.o.   MRN: [id number]  HPI  Chief Complaint  Patient presents with  . Consult    Currently using CPAP, need new machine    86 year old ex-smoker presents to establish care for OSA and COPD.  He was last seen in 2018 by my partner Dr. Lamonte Sakai.  Previous PFTs have noted moderate airway obstruction, he was on Advair for many years and over the past 3 years he was switched to Trelegy which is worked well for him.  He denies any hospitalizations or flares over the past 3 years. He does get a chest cold every year which is treated by his PCP.  OSA was diagnosed in 2015 of moderate degree, he was placed on what seems like auto CPAP with full facemask and he did not like this at first but then when this was changed to a set pressure he settled down with it. This really helped with his daytime somnolence and fatigue and cut down his frequent awakenings. He has been religious about using his machine, wife states that he rests well and no daytime somnolence. Epworth sleepiness score is 3.  Bedtime is between 930 and 10 PM, sleep latency is minimal, reports 3-4 nocturnal awakenings and is out of bed by 8 AM feeling rested without dryness of mouth or headaches. He has lost 36 pounds from 2 1 8  pounds at the time of the study to his current weight of 182 pounds  He is getting an error message on his machine that states that he has exceeded life expectancy and he would like to obtain a new machine   Significant tests/ events reviewed  NPSG 02/2013- wt 214 lbs -AHI 14/hour RDI 24/hour, lowest desaturation 70%, 22 minutes less than 88%  PFTs 03/2013 -ratio 54, FEV1 1.26/53%, FVC 70%, DLCO 89%. Spirometry 11/2014 ratio 55, FEV1 1.52/60%, FVC 85%  02/2012 CT chest without contrast >> no nodule  Past Medical History:  Diagnosis Date  . ASCVD (arteriosclerotic cardiovascular disease)   . COPD (chronic obstructive pulmonary disease)  ([place])   . Hyperlipidemia   . Hypertension   . Vertigo     Past Surgical History:  Procedure Laterality Date  . CARDIAC SURGERY    . CORONARY ARTERY BYPASS GRAFT      Allergies  Allergen Reactions  . Lipitor [Atorvastatin]     Social History   Socioeconomic History  . Marital status: Divorced    Spouse name: Not on file  . Number of children: Not on file  . Years of education: Not on file  . Highest education level: Not on file  Occupational History  . Not on file  Tobacco Use  . Smoking status: Former Smoker    Packs/day: 1.00    Years: 20.00    Pack years: 20.00    Types: Cigarettes    Quit date: 11/19/1997    Years since quitting: 22.2  . Smokeless tobacco: Never Used  Substance and Sexual Activity  . Alcohol use: Yes  . Drug use: No  . Sexual activity: Not on file  Other Topics Concern  . Not on file  Social History Narrative  . Not on file   Social Determinants of Health   Financial Resource Strain: Not on file  Food Insecurity: Not on file  Transportation Needs: Not on file  Physical Activity: Not on file  Stress: Not on file  Social Connections: Not on file  Intimate Partner Violence: Not on file    Family History  Problem Relation Age of Onset  . Healthy Mother   . Cancer Father   . Cancer Brother   . Cancer Brother   . Healthy Sister   . Healthy Sister       Review of Systems  Constitutional: negative for anorexia, fevers and sweats  Eyes: negative for irritation, redness and visual disturbance  Ears, nose, mouth, throat, and face: negative for earaches, epistaxis, nasal congestion and sore throat  Respiratory: negative for cough, dyspnea on exertion, sputum and wheezing  Cardiovascular: negative for chest pain, dyspnea, lower extremity edema, orthopnea, palpitations and syncope  Gastrointestinal: negative for abdominal pain, constipation, diarrhea, melena, nausea and vomiting  Genitourinary:negative for dysuria, frequency and  hematuria  Hematologic/lymphatic: negative for bleeding, easy bruising and lymphadenopathy  Musculoskeletal:negative for arthralgias, muscle weakness and stiff joints  Neurological: negative for coordination problems, gait problems, headaches and weakness  Endocrine: negative for diabetic symptoms including polydipsia, polyuria and weight loss     Objective:   Physical Exam  Gen. Pleasant, well-nourished,elderly, in no distress, normal affect ENT - no pallor,icterus, no post nasal drip Neck: No JVD, no thyromegaly, no carotid bruits Lungs: no use of accessory muscles, no dullness to percussion, clear without rales or rhonchi  Cardiovascular: Rhythm regular, heart sounds  normal, no murmurs or gallops, no peripheral edema Abdomen: soft and non-tender, no hepatosplenomegaly, BS normal. Musculoskeletal: No deformities, no cyanosis or clubbing Neuro:  alert, non focal       Assessment & Plan:
